# Patient Record
Sex: Female | Born: 1980 | Race: White | Hispanic: No | Smoking: Former smoker
Health system: Southern US, Community
[De-identification: ages and names within clinical notes are randomized; demographics above are authoritative.]

## PROBLEM LIST (undated history)

## (undated) DIAGNOSIS — N189 Chronic kidney disease, unspecified: Secondary | ICD-10-CM

## (undated) DIAGNOSIS — K8020 Calculus of gallbladder without cholecystitis without obstruction: Secondary | ICD-10-CM

## (undated) DIAGNOSIS — Z789 Other specified health status: Secondary | ICD-10-CM

## (undated) DIAGNOSIS — R87619 Unspecified abnormal cytological findings in specimens from cervix uteri: Secondary | ICD-10-CM

## (undated) HISTORY — PX: CHOLECYSTECTOMY: SHX55

## (undated) HISTORY — DX: Unspecified abnormal cytological findings in specimens from cervix uteri: R87.619

## (undated) HISTORY — PX: LEEP: SHX91

## (undated) HISTORY — PX: DILATION AND CURETTAGE OF UTERUS: SHX78

## (undated) HISTORY — PX: GALLBLADDER SURGERY: SHX652

## (undated) HISTORY — PX: OTHER SURGICAL HISTORY: SHX169

## (undated) HISTORY — DX: Calculus of gallbladder without cholecystitis without obstruction: K80.20

---

## 2003-02-07 ENCOUNTER — Encounter: Admission: RE | Admit: 2003-02-07 | Discharge: 2003-02-07 | Payer: Self-pay | Admitting: Sports Medicine

## 2003-02-14 ENCOUNTER — Encounter: Admission: RE | Admit: 2003-02-14 | Discharge: 2003-02-14 | Payer: Self-pay | Admitting: Sports Medicine

## 2003-02-14 ENCOUNTER — Other Ambulatory Visit: Admission: RE | Admit: 2003-02-14 | Discharge: 2003-02-14 | Payer: Self-pay | Admitting: Family Medicine

## 2003-03-04 ENCOUNTER — Inpatient Hospital Stay (HOSPITAL_COMMUNITY): Admission: AD | Admit: 2003-03-04 | Discharge: 2003-03-04 | Payer: Self-pay | Admitting: Obstetrics & Gynecology

## 2003-03-06 ENCOUNTER — Ambulatory Visit (HOSPITAL_COMMUNITY): Admission: AD | Admit: 2003-03-06 | Discharge: 2003-03-06 | Payer: Self-pay | Admitting: Obstetrics and Gynecology

## 2003-03-06 ENCOUNTER — Encounter (INDEPENDENT_AMBULATORY_CARE_PROVIDER_SITE_OTHER): Payer: Self-pay | Admitting: *Deleted

## 2003-03-22 ENCOUNTER — Encounter: Admission: RE | Admit: 2003-03-22 | Discharge: 2003-03-22 | Payer: Self-pay | Admitting: Family Medicine

## 2003-07-19 ENCOUNTER — Emergency Department (HOSPITAL_COMMUNITY): Admission: EM | Admit: 2003-07-19 | Discharge: 2003-07-19 | Payer: Self-pay | Admitting: Emergency Medicine

## 2004-01-23 ENCOUNTER — Ambulatory Visit: Payer: Self-pay | Admitting: Family Medicine

## 2004-01-30 ENCOUNTER — Ambulatory Visit: Payer: Self-pay | Admitting: Sports Medicine

## 2004-01-30 ENCOUNTER — Other Ambulatory Visit: Admission: RE | Admit: 2004-01-30 | Discharge: 2004-01-30 | Payer: Self-pay | Admitting: Family Medicine

## 2004-03-03 ENCOUNTER — Ambulatory Visit: Payer: Self-pay | Admitting: Family Medicine

## 2004-03-03 ENCOUNTER — Ambulatory Visit (HOSPITAL_COMMUNITY): Admission: RE | Admit: 2004-03-03 | Discharge: 2004-03-03 | Payer: Self-pay | Admitting: Family Medicine

## 2004-04-01 ENCOUNTER — Ambulatory Visit: Payer: Self-pay | Admitting: Family Medicine

## 2004-04-22 ENCOUNTER — Ambulatory Visit (HOSPITAL_COMMUNITY): Admission: RE | Admit: 2004-04-22 | Discharge: 2004-04-22 | Payer: Self-pay | Admitting: Family Medicine

## 2004-04-27 ENCOUNTER — Ambulatory Visit: Payer: Self-pay | Admitting: Family Medicine

## 2004-05-28 ENCOUNTER — Ambulatory Visit: Payer: Self-pay | Admitting: Family Medicine

## 2004-06-02 ENCOUNTER — Ambulatory Visit: Payer: Self-pay | Admitting: Family Medicine

## 2004-07-03 ENCOUNTER — Ambulatory Visit: Payer: Self-pay | Admitting: Sports Medicine

## 2004-07-20 ENCOUNTER — Ambulatory Visit: Payer: Self-pay | Admitting: Family Medicine

## 2004-08-03 ENCOUNTER — Ambulatory Visit: Payer: Self-pay | Admitting: Family Medicine

## 2004-08-17 ENCOUNTER — Ambulatory Visit: Payer: Self-pay | Admitting: Family Medicine

## 2004-08-23 ENCOUNTER — Ambulatory Visit: Payer: Self-pay | Admitting: Family Medicine

## 2004-08-23 ENCOUNTER — Inpatient Hospital Stay (HOSPITAL_COMMUNITY): Admission: AD | Admit: 2004-08-23 | Discharge: 2004-08-23 | Payer: Self-pay | Admitting: *Deleted

## 2004-08-31 ENCOUNTER — Ambulatory Visit: Payer: Self-pay | Admitting: Family Medicine

## 2004-09-07 ENCOUNTER — Inpatient Hospital Stay (HOSPITAL_COMMUNITY): Admission: AD | Admit: 2004-09-07 | Discharge: 2004-09-09 | Payer: Self-pay | Admitting: Obstetrics and Gynecology

## 2004-09-07 ENCOUNTER — Ambulatory Visit: Payer: Self-pay | Admitting: Family Medicine

## 2004-09-30 ENCOUNTER — Ambulatory Visit: Payer: Self-pay | Admitting: Family Medicine

## 2004-12-01 ENCOUNTER — Ambulatory Visit: Payer: Self-pay | Admitting: Sports Medicine

## 2005-02-04 ENCOUNTER — Encounter (INDEPENDENT_AMBULATORY_CARE_PROVIDER_SITE_OTHER): Payer: Self-pay | Admitting: *Deleted

## 2005-02-08 ENCOUNTER — Emergency Department (HOSPITAL_COMMUNITY): Admission: EM | Admit: 2005-02-08 | Discharge: 2005-02-08 | Payer: Self-pay | Admitting: Family Medicine

## 2005-02-18 ENCOUNTER — Ambulatory Visit: Payer: Self-pay | Admitting: Family Medicine

## 2005-03-01 ENCOUNTER — Ambulatory Visit: Payer: Self-pay | Admitting: Family Medicine

## 2005-03-17 ENCOUNTER — Emergency Department (HOSPITAL_COMMUNITY): Admission: EM | Admit: 2005-03-17 | Discharge: 2005-03-17 | Payer: Self-pay | Admitting: Emergency Medicine

## 2005-03-23 ENCOUNTER — Emergency Department (HOSPITAL_COMMUNITY): Admission: EM | Admit: 2005-03-23 | Discharge: 2005-03-23 | Payer: Self-pay | Admitting: Emergency Medicine

## 2005-03-23 ENCOUNTER — Ambulatory Visit: Payer: Self-pay | Admitting: Sports Medicine

## 2005-03-30 ENCOUNTER — Ambulatory Visit: Payer: Self-pay | Admitting: Sports Medicine

## 2005-04-29 ENCOUNTER — Emergency Department (HOSPITAL_COMMUNITY): Admission: EM | Admit: 2005-04-29 | Discharge: 2005-04-29 | Payer: Self-pay | Admitting: Family Medicine

## 2005-05-07 ENCOUNTER — Ambulatory Visit: Payer: Self-pay | Admitting: Family Medicine

## 2005-05-10 ENCOUNTER — Emergency Department (HOSPITAL_COMMUNITY): Admission: EM | Admit: 2005-05-10 | Discharge: 2005-05-10 | Payer: Self-pay | Admitting: Family Medicine

## 2005-05-17 ENCOUNTER — Ambulatory Visit: Payer: Self-pay | Admitting: Family Medicine

## 2005-06-04 ENCOUNTER — Emergency Department (HOSPITAL_COMMUNITY): Admission: EM | Admit: 2005-06-04 | Discharge: 2005-06-04 | Payer: Self-pay | Admitting: Family Medicine

## 2005-07-29 ENCOUNTER — Emergency Department (HOSPITAL_COMMUNITY): Admission: EM | Admit: 2005-07-29 | Discharge: 2005-07-29 | Payer: Self-pay | Admitting: Emergency Medicine

## 2005-08-02 ENCOUNTER — Ambulatory Visit: Payer: Self-pay | Admitting: Family Medicine

## 2005-10-18 ENCOUNTER — Ambulatory Visit: Payer: Self-pay | Admitting: Family Medicine

## 2005-11-12 ENCOUNTER — Ambulatory Visit: Payer: Self-pay | Admitting: Family Medicine

## 2005-11-13 ENCOUNTER — Emergency Department (HOSPITAL_COMMUNITY): Admission: EM | Admit: 2005-11-13 | Discharge: 2005-11-13 | Payer: Self-pay | Admitting: Emergency Medicine

## 2006-01-07 ENCOUNTER — Ambulatory Visit: Payer: Self-pay | Admitting: Family Medicine

## 2006-03-04 ENCOUNTER — Encounter (INDEPENDENT_AMBULATORY_CARE_PROVIDER_SITE_OTHER): Payer: Self-pay | Admitting: *Deleted

## 2006-03-28 ENCOUNTER — Ambulatory Visit: Payer: Self-pay | Admitting: Family Medicine

## 2006-06-30 ENCOUNTER — Other Ambulatory Visit: Admission: RE | Admit: 2006-06-30 | Discharge: 2006-06-30 | Payer: Self-pay | Admitting: Sports Medicine

## 2006-06-30 ENCOUNTER — Encounter (INDEPENDENT_AMBULATORY_CARE_PROVIDER_SITE_OTHER): Payer: Self-pay | Admitting: Family Medicine

## 2006-06-30 ENCOUNTER — Ambulatory Visit: Payer: Self-pay | Admitting: Sports Medicine

## 2006-06-30 DIAGNOSIS — F17200 Nicotine dependence, unspecified, uncomplicated: Secondary | ICD-10-CM

## 2006-06-30 LAB — CONVERTED CEMR LAB
Beta hcg, urine, semiquantitative: NEGATIVE
CO2: 22 meq/L (ref 19–32)
Chlamydia, DNA Probe: NEGATIVE
Chloride: 108 meq/L (ref 96–112)
GC Probe Amp, Genital: NEGATIVE
Glucose, Bld: 94 mg/dL (ref 70–99)
Platelets: 277 10*3/uL (ref 150–400)
Potassium: 3.7 meq/L (ref 3.5–5.3)
RBC: 5.01 M/uL (ref 3.87–5.11)
Sodium: 144 meq/L (ref 135–145)
WBC: 10.1 10*3/uL (ref 4.0–10.5)

## 2006-07-18 ENCOUNTER — Encounter (INDEPENDENT_AMBULATORY_CARE_PROVIDER_SITE_OTHER): Payer: Self-pay | Admitting: Family Medicine

## 2006-07-21 ENCOUNTER — Telehealth: Payer: Self-pay | Admitting: *Deleted

## 2006-08-16 ENCOUNTER — Ambulatory Visit: Payer: Self-pay | Admitting: Family Medicine

## 2007-04-28 ENCOUNTER — Emergency Department (HOSPITAL_COMMUNITY): Admission: EM | Admit: 2007-04-28 | Discharge: 2007-04-28 | Payer: Self-pay | Admitting: Emergency Medicine

## 2007-05-21 ENCOUNTER — Emergency Department (HOSPITAL_COMMUNITY): Admission: EM | Admit: 2007-05-21 | Discharge: 2007-05-21 | Payer: Self-pay | Admitting: Family Medicine

## 2007-06-13 ENCOUNTER — Ambulatory Visit: Payer: Self-pay | Admitting: Family Medicine

## 2007-06-13 LAB — CONVERTED CEMR LAB
Ketones, urine, test strip: NEGATIVE
Nitrite: NEGATIVE
Urobilinogen, UA: 0.2

## 2007-10-10 ENCOUNTER — Other Ambulatory Visit: Admission: RE | Admit: 2007-10-10 | Discharge: 2007-10-10 | Payer: Self-pay | Admitting: Family Medicine

## 2007-10-10 ENCOUNTER — Ambulatory Visit: Payer: Self-pay | Admitting: Family Medicine

## 2007-10-10 ENCOUNTER — Encounter: Payer: Self-pay | Admitting: Family Medicine

## 2007-10-10 DIAGNOSIS — E663 Overweight: Secondary | ICD-10-CM | POA: Insufficient documentation

## 2008-01-22 ENCOUNTER — Telehealth: Payer: Self-pay | Admitting: *Deleted

## 2008-01-23 ENCOUNTER — Ambulatory Visit: Payer: Self-pay | Admitting: Family Medicine

## 2008-06-08 ENCOUNTER — Emergency Department (HOSPITAL_COMMUNITY): Admission: EM | Admit: 2008-06-08 | Discharge: 2008-06-08 | Payer: Self-pay | Admitting: Emergency Medicine

## 2008-06-10 ENCOUNTER — Telehealth: Payer: Self-pay | Admitting: Family Medicine

## 2008-06-11 ENCOUNTER — Ambulatory Visit: Payer: Self-pay | Admitting: Family Medicine

## 2008-06-17 ENCOUNTER — Telehealth: Payer: Self-pay | Admitting: Family Medicine

## 2008-06-17 ENCOUNTER — Ambulatory Visit: Payer: Self-pay | Admitting: Family Medicine

## 2008-07-21 IMAGING — CT CT ABDOMEN W/O CM
2 of 4 series · 17 of 46 positions shown, 19 images · non-contrast
Comparison: None

CT ABDOMEN

CLINICAL DATA: Abdominal and left flank pain

CT ABDOMEN AND PELVIS WITHOUT CONTRAST
TECHNIQUE: Multidetector CT imaging of the abdomen and pelvis was
performed following the standard
protocol without intravenous contrast.

[Series 2: renal stone 5.0 b31f st · axial · 0.81mm/px · z∈[-493,-53]mm · 14 of 96 slices shown, 16 images]
[im 4/96  soft-tissue]
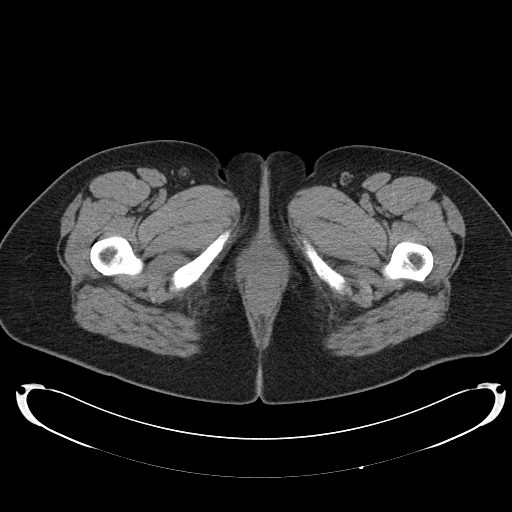
[im 4/96  bone]
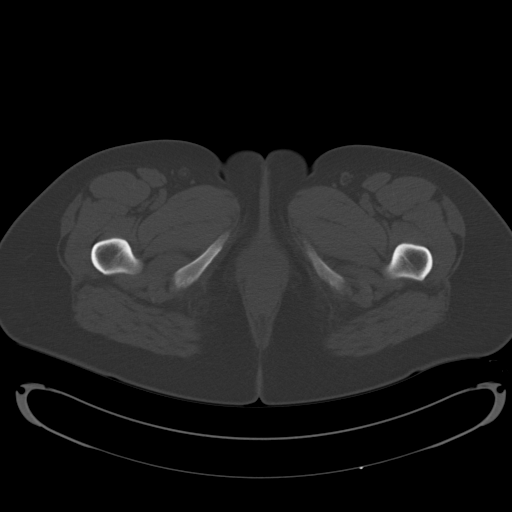
[im 12/96  soft-tissue]
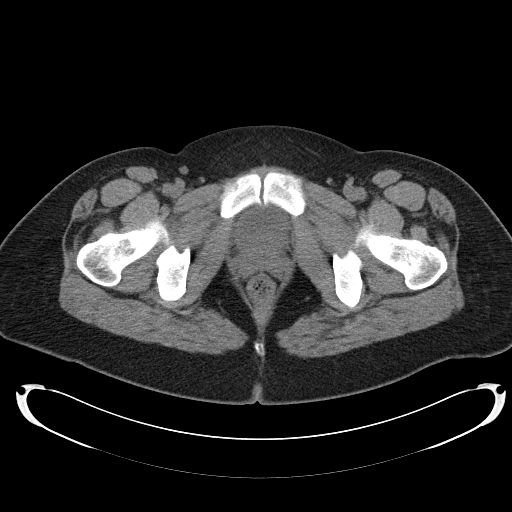
[im 20/96  soft-tissue]
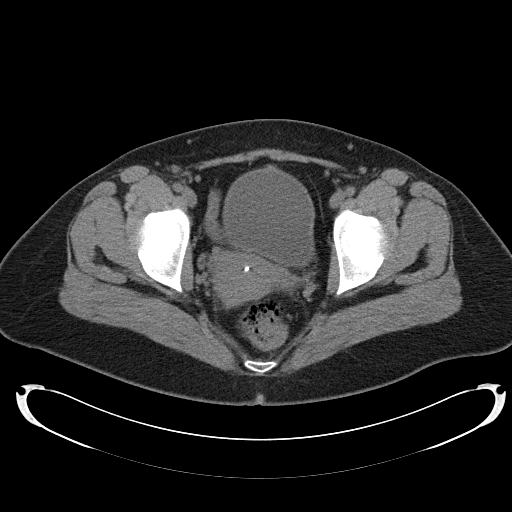
[im 24/96  soft-tissue]
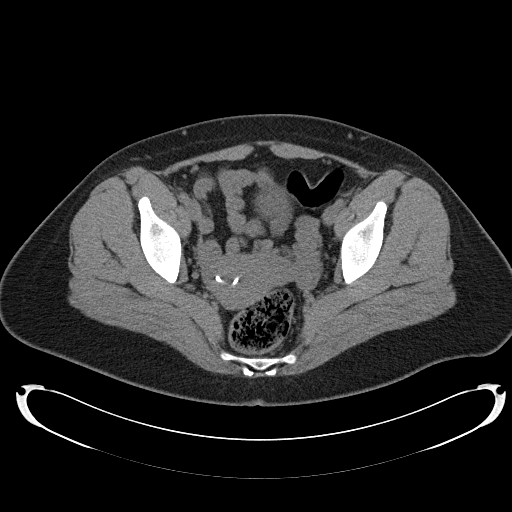
[im 32/96  soft-tissue]
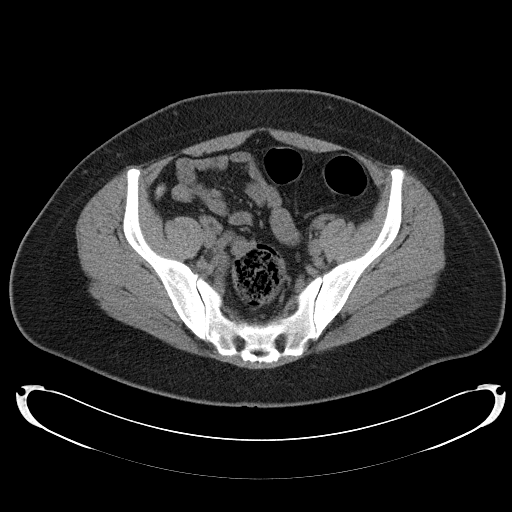
[im 40/96  soft-tissue]
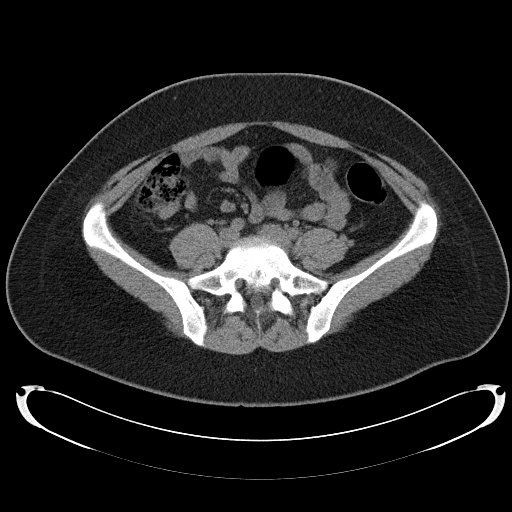
[im 44/96  soft-tissue]
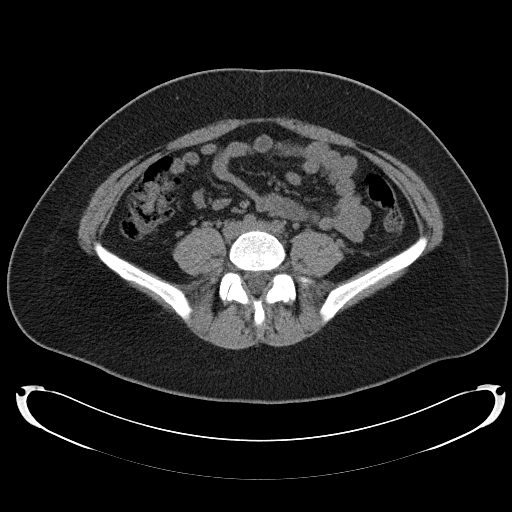
[im 52/96  soft-tissue]
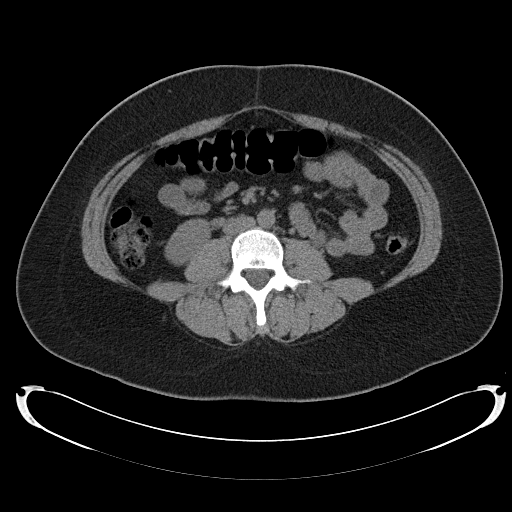
[im 56/96  soft-tissue]
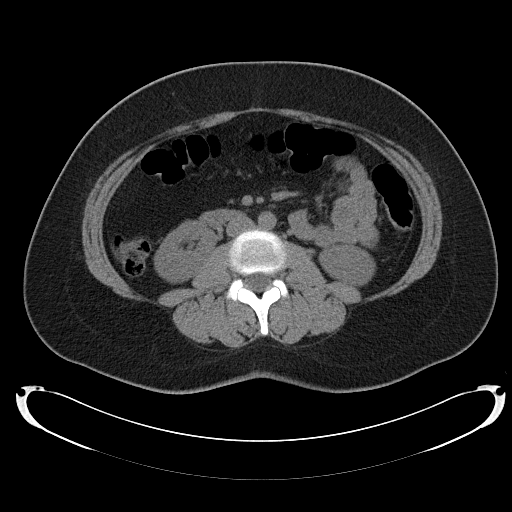
[im 56/96  bone]
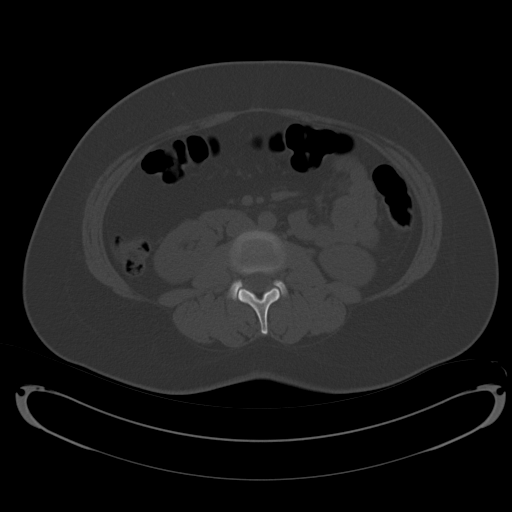
[im 64/96  soft-tissue]
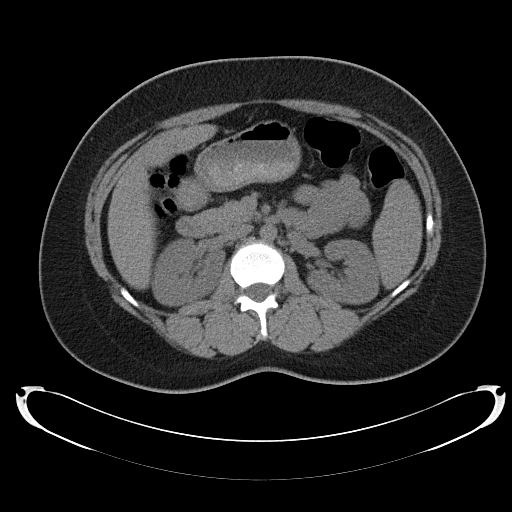
[im 72/96  soft-tissue]
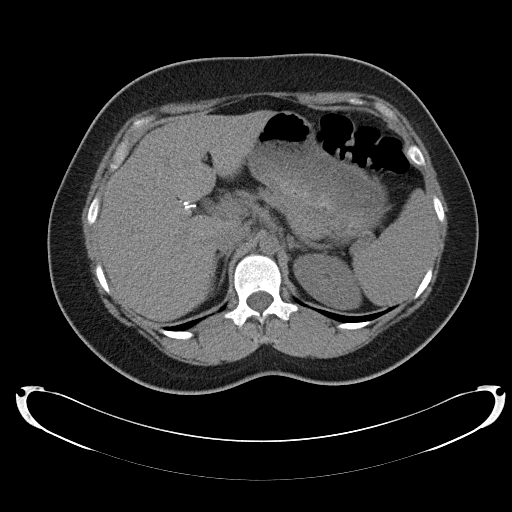
[im 76/96  soft-tissue]
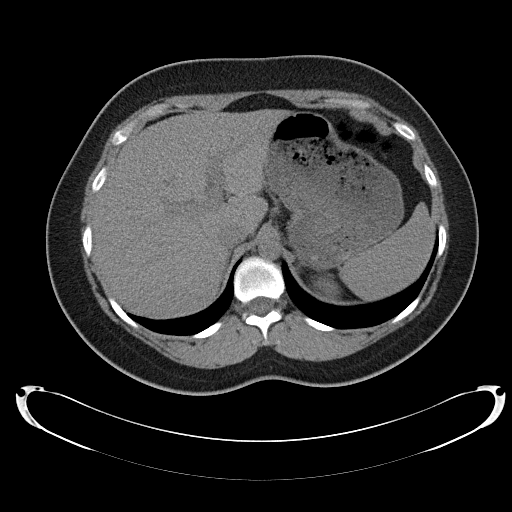
[im 84/96  soft-tissue]
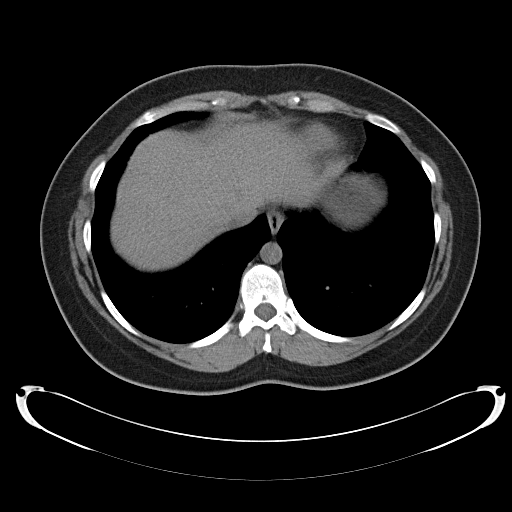
[im 92/96  soft-tissue]
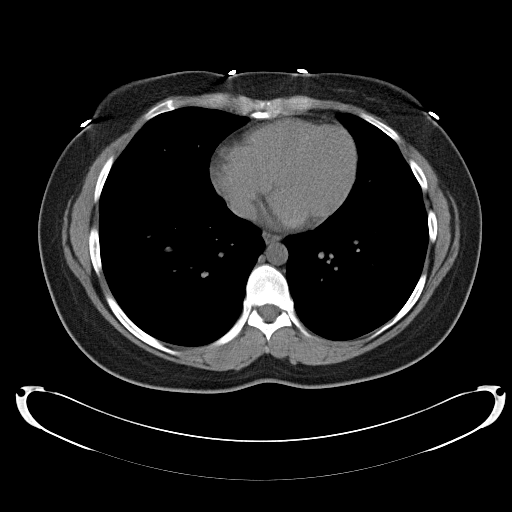

[Series 5: renal stone 2.0 spo cor st · coronal · 0.94mm/px · 3 of 107 slices shown]
[im 36/107  soft-tissue]
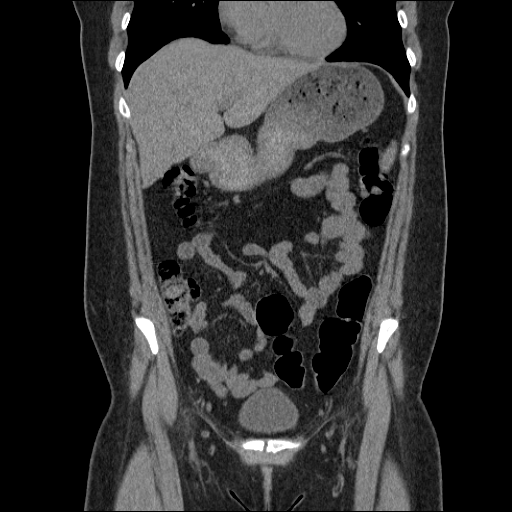
[im 48/107  soft-tissue]
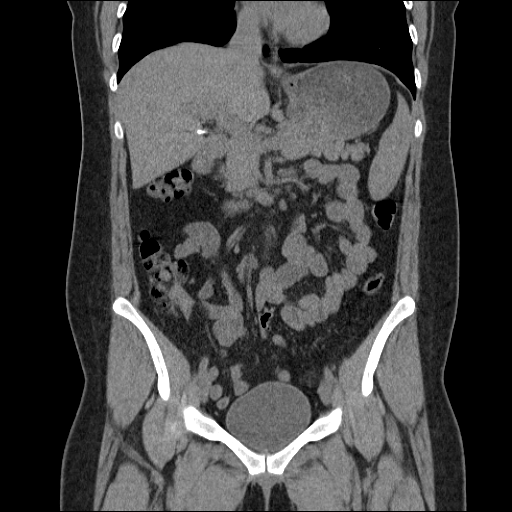
[im 59/107  soft-tissue]
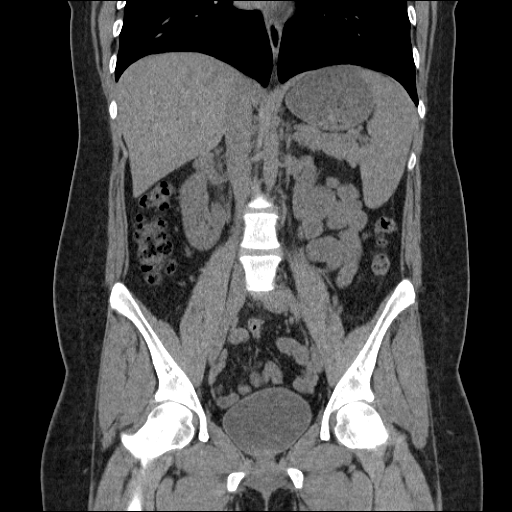

[17 of 46 positions shown; findings below may reference images not displayed]

FINDINGS: The lung bases are clear.  Abdominal viscera are
unremarkable.  Specifically, no radiopaque renal or ureteral
calculus is identified.  No hydronephrosis.  Cholecystectomy clips
are identified.  No ascites or lymphadenopathy.
IMPRESSION: No acute intra-abdominal finding.  No renal or ureteral radiopaque
calculus identified.

CT PELVIS
FINDINGS: An IUD is in place within the uterus.  Pelvic viscera
are otherwise unremarkable.  No lymphadenopathy or free fluid.
Osseous structures are intact. Incidental note is made of partial
fusion of the right sacroiliac joint.
IMPRESSION: No acute intrapelvic finding.

## 2008-12-05 ENCOUNTER — Other Ambulatory Visit: Admission: RE | Admit: 2008-12-05 | Discharge: 2008-12-05 | Payer: Self-pay | Admitting: Family Medicine

## 2008-12-05 ENCOUNTER — Encounter: Payer: Self-pay | Admitting: Family Medicine

## 2008-12-05 ENCOUNTER — Ambulatory Visit: Payer: Self-pay | Admitting: Family Medicine

## 2008-12-05 LAB — CONVERTED CEMR LAB
ALT: 10 units/L (ref 0–35)
AST: 13 units/L (ref 0–37)
CO2: 22 meq/L (ref 19–32)
Chlamydia, DNA Probe: NEGATIVE
Chloride: 104 meq/L (ref 96–112)
Creatinine, Ser: 0.76 mg/dL (ref 0.40–1.20)
HCT: 39.7 % (ref 36.0–46.0)
MCV: 84.3 fL (ref 78.0–100.0)
Platelets: 300 10*3/uL (ref 150–400)
RDW: 12.8 % (ref 11.5–15.5)
Sodium: 138 meq/L (ref 135–145)
Total Bilirubin: 0.6 mg/dL (ref 0.3–1.2)
Total Protein: 6.7 g/dL (ref 6.0–8.3)
WBC: 10.6 10*3/uL — ABNORMAL HIGH (ref 4.0–10.5)

## 2008-12-26 ENCOUNTER — Encounter: Payer: Self-pay | Admitting: Family Medicine

## 2009-05-16 ENCOUNTER — Ambulatory Visit: Payer: Self-pay | Admitting: Family Medicine

## 2009-11-12 ENCOUNTER — Encounter: Payer: Self-pay | Admitting: Family Medicine

## 2009-12-16 ENCOUNTER — Encounter: Payer: Self-pay | Admitting: Family Medicine

## 2010-02-03 NOTE — Miscellaneous (Signed)
  Clinical Lists Changes  Problems: Changed problem from INSERTION, IUD 08/2006 MIRENA (ICD-V25.1) to IUD SURVEILLANCE (ICD-V25.42)

## 2010-02-03 NOTE — Assessment & Plan Note (Signed)
Summary: contraceptive managment   Vital Signs:  Patient profile:   30 year old female Height:      68 inches Weight:      196.1 pounds BMI:     29.92 Temp:     99.5 degrees F oral Pulse rate:   95 / minute BP sitting:   125 / 79  (right arm) Cuff size:   regular  Vitals Entered By: Garen Grams LPN (May 16, 2009 3:54 PM) CC: IUD removal and replacemant Is Patient Diabetic? No Pain Assessment Patient in pain? no        Primary Care Provider:  Cat Ta MD  CC:  IUD removal and replacemant.  History of Present Illness: requests mirena IUD removal and replacement: Pt states that she feels she recieved her IUD 5 years ago after the birth of her son.  On review of the chart I found that she had it placed 08/2006.  Therefore the IUD is good for another 1.5 years.  Pt is uptodate on pap smear recieved on 12/2009.    Habits & Providers  Alcohol-Tobacco-Diet     Tobacco Status: never  Current Medications (verified): 1)  Flonase 50 Mcg/act Susp (Fluticasone Propionate) .... Two Squirts in Each Nostril Daily. 2)  Triamcinolone Acetonide 0.5 % Crea (Triamcinolone Acetonide) .... Apply To Affected Areas Two Times A Day X 1 Week Then Once A Day X 1 Week (Avoid The Use On The Face) Disp: Large  Allergies (verified): No Known Drug Allergies  Social History: Smoking Status:  never  Review of Systems  The patient denies anorexia, fever, weight loss, chest pain, and syncope.    Physical Exam  General:  Well-developed,well-nourished,in no acute distress; alert,appropriate and cooperative throughout examination Lungs:  Normal respiratory effort, chest expands symmetrically. Msk:  normal gait Neurologic:  alert & oriented X3.   Skin:  Intact without suspicious lesions or rashes Psych:  Cognition and judgment appear intact. Alert and cooperative with normal attention span and concentration. No apparent delusions, illusions, hallucinations   Impression & Recommendations:  Problem  # 1:  Preventive Health Care (ICD-V70.0) discussed with pt how medicaid will not pay for removal and reinsertion of IUD prior to the 5 year time span.  I told pt of my concern of her having to pay out of pocket for this service.  Pt states that she will wait until the 5 year mark and return at that time.   Did take the opportunity at this visit to encouage her to stop smoking as well as to encourage weight loss.    Problem # 2:  SMOKER (ICD-305.1) discussed importance of smoking cessation.  Complete Medication List: 1)  Flonase 50 Mcg/act Susp (Fluticasone propionate) .... Two squirts in each nostril daily. 2)  Triamcinolone Acetonide 0.5 % Crea (Triamcinolone acetonide) .... Apply to affected areas two times a day x 1 week then once a day x 1 week (avoid the use on the face) disp: large  Other Orders: [facility]- Est Level  3 ([id number])  Patient Instructions: 1)  You need to return for replacement of mirena in 08/2011.   Was placed 08/2006.    2)  Return as needed. 3)  Talk with Rudell Cobb about reduced fees for medical care since medicaid may be running out.

## 2010-02-05 NOTE — Miscellaneous (Signed)
Summary: Changing prob list   Clinical Lists Changes  Problems: Removed problem of SMOKER (ICD-305.1) Removed problem of CONTRACEPTIVE MANAGEMENT (ICD-V25.09) Removed problem of PHYSICAL EXAMINATION (ICD-V70.0) Removed problem of CONTACT OR EXPOSURE TO OTHER VIRAL DISEASES (ICD-V01.79) Removed problem of RASH AND OTHER NONSPECIFIC SKIN ERUPTION (ICD-782.1) Removed problem of VAGINAL PRURITUS (ICD-698.1) Removed problem of UPPER RESPIRATORY INFECTION (ICD-465.9) Removed problem of IUD SURVEILLANCE (ICD-V25.42) Removed problem of SCREENING FOR MALIGNANT NEOPLASM, CERVIX (ICD-V76.2) Removed problem of Status post  COLPOSCOPY CERVIX VAG LOOP ELTRD BX CERVIX ([id number]) - LEEP procedure 2005 Removed problem of PAP SMEAR, ABNORMAL, ASCUS (ICD-795.01)

## 2010-05-22 NOTE — Op Note (Signed)
NAMEAUBRYANA, Debra Dominguez                       ACCOUNT NO.:  [id number]   MEDICAL RECORD NO.:  [id number]                   PATIENT TYPE:  [facility]   LOCATION:  [facility]                                 FACILITY:  [facility]   PHYSICIAN:  Tanya S. Shawnie Pons, M.D.                DATE OF BIRTH:  Feb 25, 1980   DATE OF PROCEDURE:  03/06/2003  DATE OF DISCHARGE:                                 OPERATIVE REPORT   PREOPERATIVE DIAGNOSIS:  Missed abortion.   POSTOPERATIVE DIAGNOSIS:  Missed abortion.   PROCEDURE:  Suction dilation and curettage.   SURGEON:  Shelbie Proctor. Shawnie Pons, M.D.   ANESTHESIA:  MAC.   ANESTHESIOLOGIST:  Burnett Corrente, M.D.   ESTIMATED BLOOD LOSS:  Less than 25 mL.   COMPLICATIONS:  None.   SPECIMENS:  Uterine contents.   REASON FOR PROCEDURE:  The patient is a 30 year old gravida 2, para 1, who  had a missed AB at approximately seven weeks.  She asked for a D&C.  On the  day of the surgery, she actually came in with heavy vaginal bleeding and the  gestational sac was found to be in the lower uterine segment.   PROCEDURE:  The patient was taken to the OR.  She was placed in the dorsal  lithotomy position  in Dupo stirrups.  Anesthesia was administered.  She  was prepped and draped in the usual sterile fashion.  A red rubber catheter  was used to drain her bladder.  The speculum was placed in the vagina.  The  cervix was visualized and grasped with a single tooth tenaculum.  The cervix  was injected with 1% Marcaine with epinephrine paracervical block.  Dilation  did not have to be performed as the patient was already dilated.  The uterus  was sounded to 9 cm.  Suction was used to empty the uterus x 2 and then  curettage was used to remove the rest of the uterine contents.  The suction  was passed, again, x 2 with very little tissue or blood found.  The curet  was passed again and there was a gritty texture in all four quadrants.  At  this time, all instrument, needle, and lap counts  were correct x 2.  The  patient was awakened and taken to recovery room in stable condition.                                               Shelbie Proctor. Shawnie Pons, M.D.    TSP/MEDQ  D:  03/06/2003  T:  03/06/2003  Job:  [id number]

## 2010-07-16 ENCOUNTER — Other Ambulatory Visit (HOSPITAL_COMMUNITY)
Admission: RE | Admit: 2010-07-16 | Discharge: 2010-07-16 | Disposition: A | Discharge status: Home / Self Care | Payer: Medicaid Other | Source: Ambulatory Visit | Attending: Family Medicine | Admitting: Family Medicine

## 2010-07-16 ENCOUNTER — Encounter: Payer: Self-pay | Admitting: Family Medicine

## 2010-07-16 ENCOUNTER — Ambulatory Visit (INDEPENDENT_AMBULATORY_CARE_PROVIDER_SITE_OTHER): Payer: Medicaid Other | Admitting: Family Medicine

## 2010-07-16 VITALS — BP 128/85 | HR 76 | Ht 67.0 in | Wt 188.0 lb

## 2010-07-16 DIAGNOSIS — Z01419 Encounter for gynecological examination (general) (routine) without abnormal findings: Secondary | ICD-10-CM | POA: Insufficient documentation

## 2010-07-16 DIAGNOSIS — Z01818 Encounter for other preprocedural examination: Secondary | ICD-10-CM

## 2010-07-16 DIAGNOSIS — E663 Overweight: Secondary | ICD-10-CM

## 2010-07-16 DIAGNOSIS — F17200 Nicotine dependence, unspecified, uncomplicated: Secondary | ICD-10-CM

## 2010-07-16 DIAGNOSIS — Z124 Encounter for screening for malignant neoplasm of cervix: Secondary | ICD-10-CM

## 2010-07-16 DIAGNOSIS — Z1283 Encounter for screening for malignant neoplasm of skin: Secondary | ICD-10-CM

## 2010-07-16 NOTE — Progress Notes (Signed)
  Subjective:    Patient ID: Debra Dominguez, female    DOB: 1980-03-23, 30 y.o.   MRN: [id number]  HPI Pap-  Pt told that she will need yearly pap smears.  Leep in 1998- had a couple of abnormal paps since that time but the biopsies have come back negative.  Refuses GC/Chlam/RPR/HIV testing.  States she is not at risk for STD's.  No vaginal discharge. No foul odorl  Tubal- Pt requesting tubal ligation for birth control.  IUD is good for 1 more year.  States she has no desire for further children.  Had some general questions about the surgery and how it is done, etc. Would like referral to ob/gyn for this procedure.  Mole- Pt states she has lots of moles and freckles on body.  Has one small red spot on left lower let that she hits with razor a lot.  Sometimes bleeds.  Non tender.  States that she has noticed any changes in other moles or areas on skin.  Weight loss- Pt states she is not really exercising or changing her diet at this time.  Would like to know what her ideal weight would be.  Asks if weight loss is "really that important"  Pt denies any breast nodules, lumps or bumps. No nipple discharge.  PMH, Surgical hx, family hx, and social history update in chart in appropriate tabs    Review of Systems   as per above Objective:   Physical Exam  Constitutional: She is oriented to person, place, and time. She appears well-developed and well-nourished.  HENT:  Head: Normocephalic and atraumatic.  Eyes: Pupils are equal, round, and reactive to light.  Neck: Normal range of motion. No thyromegaly present.  Cardiovascular: Normal rate and regular rhythm.  Exam reveals friction rub. Exam reveals no gallop.   No murmur heard. Pulmonary/Chest: Effort normal. No respiratory distress. She has no wheezes.  Abdominal: Soft. She exhibits no distension and no mass. There is no tenderness. There is no rebound and no guarding.  Genitourinary: Uterus normal.    There is no rash or lesion on  the right labia. There is no rash on the left labia. Cervix exhibits friability. Cervix exhibits no motion tenderness and no discharge. Right adnexum displays no mass, no tenderness and no fullness. Left adnexum displays no mass, no tenderness and no fullness. No erythema or tenderness around the vagina. No signs of injury around the vagina.  Musculoskeletal: Normal range of motion. She exhibits no edema.  Neurological: She is alert and oriented to person, place, and time.  Skin: No rash noted.       Small erythematous papule approx 4mm in diameter on left lateral mid shin area.   Psychiatric: She has a normal mood and affect. Her behavior is normal. Judgment and thought content normal.          Assessment & Plan:

## 2010-07-16 NOTE — Assessment & Plan Note (Signed)
Pt requesting tubal ligation.  Risk and benefits discussed.  Handout given.  [facility]

## 2010-07-16 NOTE — Assessment & Plan Note (Signed)
Encouraged smoking cessation 

## 2010-07-16 NOTE — Patient Instructions (Signed)
I will mail you your results.  We will complete the form for your tubal. Return for removal of area on left leg. Keep eye on moles for any changing.  Return in 1 year for yearly physical Let me know if you would like to return for a follow up visit just to focus on making a plan for your weight loss.

## 2010-07-16 NOTE — Assessment & Plan Note (Signed)
Obtained today 07/16/10- will mail results to pt.  Pt to f/up in 1 year.  Sooner if any abnormality on papsmear.

## 2010-07-16 NOTE — Assessment & Plan Note (Signed)
Small red erythematous papule on left lower leg suspicious for basal cell carcinoma.  Pt to return for removal of this lesion.  Macule on left labia - Pt unaware of how long it has been present, pt to monitor, pt is to let me know if any changes.

## 2010-07-16 NOTE — Assessment & Plan Note (Signed)
Pt states her goal weight is 175.  Pt states she is not currently on a diet or using an exercise plan.  Instructed pt to make a follow up appointment if she would like me to help her to make a weight loss plan.

## 2010-09-14 ENCOUNTER — Telehealth: Payer: Self-pay | Admitting: Family Medicine

## 2010-09-14 NOTE — Telephone Encounter (Signed)
Called and left message to call back. Please tell pt: Papers for BTL were sent to [facility] 07-16-10. Pt can call [phone] and check about appt. Lorenda Hatchet, Renato Battles

## 2010-09-14 NOTE — Telephone Encounter (Signed)
Is calling about the referral to get her Tubes tide.  She was in on 7/12 but she hasn't heard anything back.

## 2010-09-15 ENCOUNTER — Telehealth: Payer: Self-pay | Admitting: *Deleted

## 2010-09-15 NOTE — Telephone Encounter (Signed)
Called pt and informed that she has appt at [facility] for BTL 09-17-10 at 3:30 pm. .Debra Dominguez

## 2010-09-17 ENCOUNTER — Ambulatory Visit (INDEPENDENT_AMBULATORY_CARE_PROVIDER_SITE_OTHER): Payer: Medicaid Other | Admitting: Obstetrics & Gynecology

## 2010-09-17 ENCOUNTER — Encounter: Payer: Self-pay | Admitting: Obstetrics & Gynecology

## 2010-09-17 VITALS — BP 128/77 | HR 96 | Temp 98.3°F | Ht 67.0 in | Wt 187.0 lb

## 2010-09-17 DIAGNOSIS — Z3009 Encounter for other general counseling and advice on contraception: Secondary | ICD-10-CM

## 2010-09-17 NOTE — Progress Notes (Addendum)
Subjective:    Patient ID: Debra Dominguez, female    DOB: 03-Dec-1980, 30 y.o.   MRN: [id number]  [id number] No LMP recorded. Patient is not currently having periods (Reason: IUD). Requesting to schedule BTL. The procedure for laparoscopic BTL was explained and the risks. Questions were answered. She signed Medicaid papers 7/12 at [facility], Dr. Edmonia Saphira Lahmann   Past Medical History  Diagnosis Date  . Cervical cancer 1998    had leep procedure- yearly pap smears now  . Gallstones    Past Surgical History  Procedure Date  . Gallbladder surgery   . Cholecystectomy    No Known Allergies. No current outpatient prescriptions on file prior to visit.   History   Social History  . Marital Status: Married    Spouse Name: N/A    Number of Children: N/A  . Years of Education: N/A   Occupational History  . Not on file.   Social History Main Topics  . Smoking status: Current Some Day Smoker -- 0.3 packs/day    Types: Cigarettes  . Smokeless tobacco: Never Used  . Alcohol Use: Yes     1 drink per month  . Drug Use: No  . Sexually Active: Yes    Birth Control/ Protection: IUD     1 partner- IUD needs to be replaced in 2013   Other Topics Concern  . 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       Review of Systemsamenorrhea, no vaginal or bladder sx     Filed Vitals:   09/17/10 1517  BP: 128/77  Pulse: 96  Temp: 98.3 F (36.8 C)  TempSrc: Oral  Height: 5\' 7"  (1.702 m)  Weight: 187 lb (84.823 kg)    Objective:   Physical Exam  Constitutional: She appears well-developed and well-nourished.  Cardiovascular: Normal rate and normal heart sounds.   Pulmonary/Chest: Breath sounds normal.  Abdominal: Soft. She exhibits no mass. There is no tenderness.  Genitourinary:       Deferred  Skin: Skin is warm and dry.  Psychiatric: She has a normal mood and affect.          Assessment & Plan:  Requests  sterilization. Schedule BTL and IUD removal.

## 2010-09-29 LAB — POCT URINALYSIS DIP (DEVICE)
Bilirubin Urine: NEGATIVE
Ketones, ur: NEGATIVE
Operator id: 247071
Specific Gravity, Urine: 1.01

## 2010-09-29 LAB — POCT PREGNANCY, URINE: Operator id: 247071

## 2010-10-01 ENCOUNTER — Encounter (HOSPITAL_COMMUNITY)
Admission: RE | Admit: 2010-10-01 | Discharge: 2010-10-01 | Disposition: A | Discharge status: Home / Self Care | Payer: Medicaid Other | Source: Ambulatory Visit | Attending: Obstetrics & Gynecology | Admitting: Obstetrics & Gynecology

## 2010-10-01 ENCOUNTER — Encounter (HOSPITAL_COMMUNITY): Payer: Self-pay

## 2010-10-01 HISTORY — DX: Other specified health status: Z78.9

## 2010-10-01 LAB — CBC
HCT: 37.4 % (ref 36.0–46.0)
Hemoglobin: 12.8 g/dL (ref 12.0–15.0)
MCV: 86.2 fL (ref 78.0–100.0)
RBC: 4.34 MIL/uL (ref 3.87–5.11)
RDW: 12.7 % (ref 11.5–15.5)
WBC: 9 10*3/uL (ref 4.0–10.5)

## 2010-10-01 LAB — SURGICAL PCR SCREEN: Staphylococcus aureus: POSITIVE — AB

## 2010-10-01 NOTE — Patient Instructions (Signed)
   Your procedure is scheduled on:10/08/10  Enter through the Main Entrance of [facility] at:1:00pm Pick up the phone at the desk and dial [phone] and inform us of your arrival  Please call this number if you have any problems the morning of surgery: [phone]  Remember: Do not eat food after midnight  Do not drink clear liquids after:1030 am Thursday Take these medicines the morning of surgery with a SIP OF WATER:none  Do not wear jewelry, make-up, or FINGER nail polish Do not wear lotions, powders, or perfumes.  You may wear deodorant. Do not shave 48 hours prior to surgery. Do not bring valuables to the hospital. Leave suitcase in the car. After Surgery it may be brought to your room. For patients being admitted to the hospital, checkout time is 11:00am the day of discharge.  Patients discharged on the day of surgery will not be allowed to drive home.   Name and phone number of your driver: Calla Kicks- [phone]   Remember to use your hibiclens as instructed.Please shower with 1/2 bottle the evening before your surgery and the other 1/2 bottle the morning of surgery.

## 2010-10-08 ENCOUNTER — Encounter (HOSPITAL_COMMUNITY)
Admission: RE | Disposition: A | Discharge status: Home / Self Care | Payer: Self-pay | Source: Ambulatory Visit | Attending: Obstetrics & Gynecology

## 2010-10-08 ENCOUNTER — Encounter (HOSPITAL_COMMUNITY): Payer: Self-pay | Admitting: Anesthesiology

## 2010-10-08 ENCOUNTER — Encounter (HOSPITAL_COMMUNITY): Payer: Self-pay | Admitting: *Deleted

## 2010-10-08 ENCOUNTER — Encounter: Payer: Self-pay | Admitting: Obstetrics & Gynecology

## 2010-10-08 ENCOUNTER — Ambulatory Visit (HOSPITAL_COMMUNITY)
Admission: RE | Admit: 2010-10-08 | Discharge: 2010-10-08 | Disposition: A | Discharge status: Home / Self Care | Payer: Medicaid Other | Source: Ambulatory Visit | Attending: Obstetrics & Gynecology | Admitting: Obstetrics & Gynecology

## 2010-10-08 ENCOUNTER — Ambulatory Visit (HOSPITAL_COMMUNITY): Payer: Medicaid Other | Admitting: Anesthesiology

## 2010-10-08 DIAGNOSIS — Z01812 Encounter for preprocedural laboratory examination: Secondary | ICD-10-CM | POA: Insufficient documentation

## 2010-10-08 DIAGNOSIS — Z302 Encounter for sterilization: Secondary | ICD-10-CM | POA: Insufficient documentation

## 2010-10-08 DIAGNOSIS — Z01818 Encounter for other preprocedural examination: Secondary | ICD-10-CM | POA: Insufficient documentation

## 2010-10-08 HISTORY — PX: LAPAROSCOPIC TUBAL LIGATION: SHX1937

## 2010-10-08 LAB — PREGNANCY, URINE: Preg Test, Ur: NEGATIVE

## 2010-10-08 SURGERY — LIGATION, FALLOPIAN TUBE, LAPAROSCOPIC
Anesthesia: General | Site: Abdomen | Laterality: Bilateral | Wound class: Clean Contaminated

## 2010-10-08 MED ORDER — FENTANYL CITRATE 0.05 MG/ML IJ SOLN
25.0000 ug | INTRAMUSCULAR | Status: DC | PRN
  Administered 2010-10-08: 25 ug via INTRAVENOUS

## 2010-10-08 MED ORDER — ONDANSETRON HCL 4 MG/2ML IJ SOLN
INTRAMUSCULAR | Status: DC | PRN
  Administered 2010-10-08: 4 mg via INTRAVENOUS

## 2010-10-08 MED ORDER — LIDOCAINE HCL (CARDIAC) 20 MG/ML IV SOLN
INTRAVENOUS | Status: DC | PRN
  Administered 2010-10-08: 80 mg via INTRAVENOUS

## 2010-10-08 MED ORDER — LACTATED RINGERS IV SOLN
INTRAVENOUS | Status: DC
  Administered 2010-10-08 (×2): via INTRAVENOUS

## 2010-10-08 MED ORDER — ROCURONIUM BROMIDE 100 MG/10ML IV SOLN
INTRAVENOUS | Status: DC | PRN
  Administered 2010-10-08: 35 mg via INTRAVENOUS

## 2010-10-08 MED ORDER — OXYCODONE-ACETAMINOPHEN 5-325 MG PO TABS
ORAL_TABLET | ORAL | Status: AC
  Administered 2010-10-08: 1 via ORAL
  Filled 2010-10-08: qty 1

## 2010-10-08 MED ORDER — ONDANSETRON HCL 4 MG/2ML IJ SOLN
INTRAMUSCULAR | Status: AC
  Filled 2010-10-08: qty 2

## 2010-10-08 MED ORDER — GLYCOPYRROLATE 0.2 MG/ML IJ SOLN
INTRAMUSCULAR | Status: DC | PRN
  Administered 2010-10-08: .4 mg via INTRAVENOUS
  Administered 2010-10-08: 0.2 mg via INTRAVENOUS

## 2010-10-08 MED ORDER — MEPERIDINE HCL 25 MG/ML IJ SOLN: 6.2500 mg | INTRAMUSCULAR | Status: DC | PRN

## 2010-10-08 MED ORDER — FENTANYL CITRATE 0.05 MG/ML IJ SOLN
INTRAMUSCULAR | Status: DC | PRN
  Administered 2010-10-08: 50 ug via INTRAVENOUS
  Administered 2010-10-08 (×2): 100 ug via INTRAVENOUS

## 2010-10-08 MED ORDER — GLYCOPYRROLATE 0.2 MG/ML IJ SOLN
INTRAMUSCULAR | Status: AC
  Filled 2010-10-08: qty 1

## 2010-10-08 MED ORDER — METOCLOPRAMIDE HCL 5 MG/ML IJ SOLN: 10.0000 mg | Freq: Once | INTRAMUSCULAR | Status: DC | PRN

## 2010-10-08 MED ORDER — MIDAZOLAM HCL 2 MG/2ML IJ SOLN
INTRAMUSCULAR | Status: AC
  Filled 2010-10-08: qty 2

## 2010-10-08 MED ORDER — DEXAMETHASONE SODIUM PHOSPHATE 10 MG/ML IJ SOLN
INTRAMUSCULAR | Status: AC
  Filled 2010-10-08: qty 1

## 2010-10-08 MED ORDER — OXYCODONE-ACETAMINOPHEN 5-325 MG PO TABS
1.0000 | ORAL_TABLET | ORAL | Status: DC | PRN
  Administered 2010-10-08: 1 via ORAL

## 2010-10-08 MED ORDER — BUPIVACAINE HCL (PF) 0.5 % IJ SOLN
INTRAMUSCULAR | Status: DC | PRN
  Administered 2010-10-08: 30 mL

## 2010-10-08 MED ORDER — FENTANYL CITRATE 0.05 MG/ML IJ SOLN
INTRAMUSCULAR | Status: AC
  Administered 2010-10-08: 25 ug via INTRAVENOUS
  Filled 2010-10-08: qty 2

## 2010-10-08 MED ORDER — LIDOCAINE HCL (CARDIAC) 20 MG/ML IV SOLN
INTRAVENOUS | Status: AC
  Filled 2010-10-08: qty 5

## 2010-10-08 MED ORDER — KETOROLAC TROMETHAMINE 30 MG/ML IJ SOLN
INTRAMUSCULAR | Status: DC | PRN
  Administered 2010-10-08: 30 mg via INTRAVENOUS

## 2010-10-08 MED ORDER — MIDAZOLAM HCL 5 MG/5ML IJ SOLN
INTRAMUSCULAR | Status: DC | PRN
  Administered 2010-10-08: 2 mg via INTRAVENOUS

## 2010-10-08 MED ORDER — PROPOFOL 10 MG/ML IV EMUL
INTRAVENOUS | Status: DC | PRN
  Administered 2010-10-08: 200 mg via INTRAVENOUS

## 2010-10-08 MED ORDER — OXYCODONE-ACETAMINOPHEN 5-325 MG PO TABS: 1.0000 | ORAL_TABLET | ORAL | Status: AC | PRN

## 2010-10-08 MED ORDER — PROPOFOL 10 MG/ML IV EMUL
INTRAVENOUS | Status: AC
  Filled 2010-10-08: qty 20

## 2010-10-08 MED ORDER — NEOSTIGMINE METHYLSULFATE 1 MG/ML IJ SOLN
INTRAMUSCULAR | Status: DC | PRN
  Administered 2010-10-08: 2.5 mg via INTRAVENOUS
  Administered 2010-10-08: 1 mg via INTRAVENOUS

## 2010-10-08 MED ORDER — FENTANYL CITRATE 0.05 MG/ML IJ SOLN
INTRAMUSCULAR | Status: AC
  Filled 2010-10-08: qty 5

## 2010-10-08 MED ORDER — DEXAMETHASONE SODIUM PHOSPHATE 4 MG/ML IJ SOLN
INTRAMUSCULAR | Status: DC | PRN
  Administered 2010-10-08: 10 mg via INTRAVENOUS

## 2010-10-08 SURGICAL SUPPLY — 19 items
CABLE HIGH FREQUENCY MONO STRZ (ELECTRODE) IMPLANT
CATH ROBINSON RED A/P 16FR (CATHETERS) ×2 IMPLANT
CHLORAPREP W/TINT 26ML (MISCELLANEOUS) ×2 IMPLANT
CLIP FILSHIE TUBAL LIGA STRL (Clip) ×2 IMPLANT
CLOTH BEACON ORANGE TIMEOUT ST (SAFETY) ×2 IMPLANT
ELECT REM PT RETURN 9FT ADLT (ELECTROSURGICAL)
ELECTRODE REM PT RTRN 9FT ADLT (ELECTROSURGICAL) IMPLANT
GLOVE BIO SURGEON STRL SZ 6.5 (GLOVE) ×4 IMPLANT
GOWN PREVENTION PLUS LG XLONG (DISPOSABLE) ×4 IMPLANT
NDL SAFETY ECLIPSE 18X1.5 (NEEDLE) ×1 IMPLANT
NEEDLE HYPO 18GX1.5 SHARP (NEEDLE) ×1
NEEDLE INSUFFLATION 14GA 120MM (NEEDLE) ×2 IMPLANT
PACK LAPAROSCOPY BASIN (CUSTOM PROCEDURE TRAY) ×2 IMPLANT
SUT VICRYL 0 UR6 27IN ABS (SUTURE) IMPLANT
SUT VICRYL 4-0 PS2 18IN ABS (SUTURE) ×2 IMPLANT
TOWEL OR 17X24 6PK STRL BLUE (TOWEL DISPOSABLE) ×4 IMPLANT
TROCAR XCEL NON-BLD 11X100MML (ENDOMECHANICALS) IMPLANT
TROCAR XCEL NON-BLD 5MMX100MML (ENDOMECHANICALS) IMPLANT
WATER STERILE IRR 1000ML POUR (IV SOLUTION) ×2 IMPLANT

## 2010-10-08 NOTE — Anesthesia Preprocedure Evaluation (Addendum)
Anesthesia Evaluation  Name, MR# and DOB Patient awake  General Assessment Comment  Reviewed: Allergy & Precautions, H&P , NPO status , Patient's Chart, lab work & pertinent test results  Airway Mallampati: II TM Distance: >3 FB Neck ROM: full    Dental No notable dental hx. (+) Teeth Intact   Pulmonary  clear to auscultation  Pulmonary exam normal       Cardiovascular regular Normal    Neuro/Psych Negative Neurological ROS  Negative Psych ROS   GI/Hepatic negative GI ROS Neg liver ROS    Endo/Other  Negative Endocrine ROS  Renal/GU negative Renal ROS  Genitourinary negative   Musculoskeletal   Abdominal   Peds  Hematology negative hematology ROS (+)   Anesthesia Other Findings   Reproductive/Obstetrics negative OB ROS                           Anesthesia Physical Anesthesia Plan  ASA: I  Anesthesia Plan: General   Post-op Pain Management:    Induction:   Airway Management Planned:   Additional Equipment:   Intra-op Plan:   Post-operative Plan:   Informed Consent: I have reviewed the patients History and Physical, chart, labs and discussed the procedure including the risks, benefits and alternatives for the proposed anesthesia with the patient or authorized representative who has indicated his/her understanding and acceptance.   Dental Advisory Given  Plan Discussed with: Anesthesiologist and CRNA  Anesthesia Plan Comments:        Anesthesia Quick Evaluation

## 2010-10-08 NOTE — H&P (Signed)
Patient ID: Debra Dominguez, female DOB: 22-Jun-1980, 30 y.o. MRN: [id number]  [id number] No LMP recorded. Patient is not currently having periods (Reason: IUD).  Requesting to schedule BTL. The procedure for laparoscopic BTL was explained and the risks. Questions were answered. She signed Medicaid papers 7/12 at [facility], Dr. Edmonia James  Past Medical History   Diagnosis  Date   .  Cervical cancer  1998     had leep procedure- yearly pap smears now   .  Gallstones     Past Surgical History   Procedure  Date   .  Gallbladder surgery    .  Cholecystectomy     No Known Allergies.  No current outpatient prescriptions on file prior to visit.    History    Social History   .  Marital Status:  Married     Spouse Name:  N/A     Number of Children:  N/A   .  Years of Education:  N/A    Occupational History   .  Not on file.    Social History Main Topics   .  Smoking status:  Current Some Day Smoker -- 0.3 packs/day     Types:  Cigarettes   .  Smokeless tobacco:  Never Used   .  Alcohol Use:  Yes      1 drink per month   .  Drug Use:  No   .  Sexually Active:  Yes     Birth Control/ Protection:  IUD      1 partner- IUD needs to be replaced in 2013    Other Topics  Concern   .  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    Review of Systemsamenorrhea, no vaginal or bladder sx   Filed Vitals:    09/17/10 1517   BP:  128/77   Pulse:  96   Temp:  98.3 F (36.8 C)   TempSrc:  Oral   Height:  5\' 7"  (1.702 m)   Weight:  187 lb (84.823 kg)    Objective:   Physical Exam  Constitutional: She appears well-developed and well-nourished.  Cardiovascular: Normal rate and normal heart sounds.  Pulmonary/Chest: Breath sounds normal.  Abdominal: Soft. She exhibits no mass. There is no tenderness.  Genitourinary:  Deferred  Skin: Skin is warm and dry.  Psychiatric: She has a normal mood and affect.    Assessment & Plan:     Requests sterilization. Schedule BTL. She will get her IUD removed at her 6 week post op check versus removing it when it expires next year (due to her history of menorrhagia).  She will decide at her post op visit.

## 2010-10-08 NOTE — Anesthesia Postprocedure Evaluation (Signed)
  Anesthesia Post-op Note  Patient: Debra Dominguez  Procedure(s) Performed:  LAPAROSCOPIC TUBAL LIGATION  Patient is awake and responsive. Pain and nausea are reasonably well controlled. Vital signs are stable and clinically acceptable. Oxygen saturation is clinically acceptable. There are no apparent anesthetic complications at this time. Patient is ready for discharge.

## 2010-10-08 NOTE — Op Note (Signed)
10/08/2010  2:52 PM  PATIENT:  Debra Dominguez  30 y.o. female  PRE-OPERATIVE DIAGNOSIS:  Desires Sterilization   POST-OPERATIVE DIAGNOSIS:  Desires Sterilization   PROCEDURE:  Procedure(s): LAPAROSCOPIC TUBAL LIGATION  SURGEON:  Surgeon(s): Kayte Borchard C. Marice Potter, MD  PHYSICIAN ASSISTANT:   ASSISTANTS: none   ANESTHESIA:   general  OR FLUID I/O:     BLOOD ADMINISTERED:none  DRAINS: none   LOCAL MEDICATIONS USED:  MARCAINE 30CC  SPECIMEN:  No Specimen  DISPOSITION OF SPECIMEN:  N/A  COUNTS:  YES  TOURNIQUET:  * No tourniquets in log *  DICTATION: .Dragon Dictation  PLAN OF CARE: Discharge to home after PACU  PATIENT DISPOSITION:  PACU - hemodynamically stable.   Delay start of Pharmacological VTE agent (>24hrs) due to surgical blood loss or risk of bleeding: No    The risks, benefits, and alternatives of surgery were explained, understood, and accepted. Consents were signed. All questions were answered. After a discussion, she has decided to leave her IUD in and told her six-week postop check when it can be removed in the office.she was taken to the operating room and general anesthesia was applied without complication. She was placed in the dorsal lithotomy position. Her vagina and abdomen were prepped and draped in the usual sterile fashion. A bimanual exam revealed a normal size and shape anteverted mobile uterus. Her adnexa are not enlarged. Her bladder was emptied with a Robinson catheter for approximately 50 mL of urine. Gloves were changed and attention was turned to the abdomen. A vertical umbilical incision was made after injecting 20 mL of 0.5% Marcaine into the subcutaneous tissue at the umbilicus. A varies needle was placed intraperitoneally. Low-flow CO2 was used to insufflate the abdomen to approximately 4 L. After a good pneumoperitoneum was established, an 11 mm Excell trocar was placed. She was placed in Trendelenburg position and laparoscopy confirmed correct  placement. The ovaries and tubes were inspected bilaterally and appeared normal. A Filshie clip was placed in the isthmic region of each oviduct. Hemostasis was noted. The CO2 was allowed to escape from the abdomen. The port was removed and a subcuticular closure was done with 4-0 Vicryl suture. Please note that prior to placing the Hulka manipulator on the cervix, I gave her a paracervical block with 10 mL of 0.5% Marcaine. She tolerated the procedure well and was taken to recovery room in stable condition.

## 2010-10-08 NOTE — Transfer of Care (Signed)
Immediate Anesthesia Transfer of Care Note  Patient: Debra Dominguez  Procedure(s) Performed:  LAPAROSCOPIC TUBAL LIGATION  Patient Location: PACU  Anesthesia Type: General  Level of Consciousness: awake, alert  and oriented  Airway & Oxygen Therapy: Patient Spontanous Breathing and Patient connected to nasal cannula oxygen  Post-op Assessment: Report given to PACU RN and Post -op Vital signs reviewed and stable  Post vital signs: stable  Complications: No apparent anesthesia complications

## 2010-10-12 ENCOUNTER — Encounter (HOSPITAL_COMMUNITY): Payer: Self-pay | Admitting: Obstetrics & Gynecology

## 2010-10-15 ENCOUNTER — Ambulatory Visit: Payer: Medicaid Other | Admitting: Obstetrics & Gynecology

## 2010-11-05 ENCOUNTER — Encounter: Payer: Self-pay | Admitting: Obstetrics & Gynecology

## 2010-11-05 ENCOUNTER — Ambulatory Visit (INDEPENDENT_AMBULATORY_CARE_PROVIDER_SITE_OTHER): Payer: Medicaid Other | Admitting: Obstetrics & Gynecology

## 2010-11-05 VITALS — BP 131/75 | HR 100 | Temp 97.6°F | Ht 68.0 in | Wt 193.6 lb

## 2010-11-05 DIAGNOSIS — Z09 Encounter for follow-up examination after completed treatment for conditions other than malignant neoplasm: Secondary | ICD-10-CM

## 2010-11-05 DIAGNOSIS — Z9889 Other specified postprocedural states: Secondary | ICD-10-CM

## 2010-11-05 NOTE — Progress Notes (Signed)
  Subjective:    Patient ID: Debra Dominguez, female    DOB: 17-May-1980, 30 y.o.   MRN: [id number]  HPI  Ms. Strength is here for a post op check and Mirena removal. She had a laproscopic tubal 10-08-10.  She has no complaints. Review of Systems     Objective:   Physical Exam  Incision healed great  Mirena removed easily    Assessment & Plan:  Post op doing well. RTC for annual. She declines a flu vaccine today.

## 2011-10-10 ENCOUNTER — Emergency Department (HOSPITAL_COMMUNITY)
Admission: EM | Admit: 2011-10-10 | Discharge: 2011-10-10 | Disposition: A | Discharge status: Home / Self Care | Payer: Medicaid Other | Source: Home / Self Care | Attending: Emergency Medicine | Admitting: Emergency Medicine

## 2011-10-10 ENCOUNTER — Encounter (HOSPITAL_COMMUNITY): Payer: Self-pay | Admitting: *Deleted

## 2011-10-10 DIAGNOSIS — IMO0002 Reserved for concepts with insufficient information to code with codable children: Secondary | ICD-10-CM

## 2011-10-10 DIAGNOSIS — T148XXA Other injury of unspecified body region, initial encounter: Secondary | ICD-10-CM

## 2011-10-10 MED ORDER — TRAMADOL HCL 50 MG PO TABS: 100.0000 mg | ORAL_TABLET | Freq: Three times a day (TID) | ORAL | Status: DC | PRN

## 2011-10-10 NOTE — ED Provider Notes (Signed)
Chief Complaint  Patient presents with  . Finger Injury    History of Present Illness:   The patient is a 31 year old female who partially detached her left thumbnail today while at home. She was opening a window and struck her thumbnail against the window sill, bending or thumbnail back. It still attached at the base but very tender to touch. She able to move the thumb well and denies any numbness or tingling.  Review of Systems:  Other than noted above, the patient denies any of the following symptoms: Systemic:  No fevers, chills, sweats, or aches.  No fatigue or tiredness. Musculoskeletal:  No joint pain, arthritis, bursitis, swelling, back pain, or neck pain. Neurological:  No muscular weakness, paresthesias, headache, or trouble with speech or coordination.  No dizziness.   PMFSH:  Past medical history, family history, social history, meds, and allergies were reviewed.  Physical Exam:   Vital signs:  BP 131/76  Pulse 96  Temp 98.1 F (36.7 C) (Oral)  Resp 18  SpO2 100%  LMP 09/30/2011 Gen:  Alert and oriented times 3.  In no distress. Musculoskeletal: The nail was very loose and tender to touch. Otherwise there was no redness, swelling, or bruising. Otherwise, all joints had a full a ROM with no swelling, bruising or deformity.  No edema, pulses full. Extremities were warm and pink.  Capillary refill was brisk.  Skin:  Clear, warm and dry.  No rash. Neuro:  Alert and oriented times 3.  Muscle strength was normal.  Sensation was intact to light touch.   Procedure Note:  Verbal informed consent was obtained from the patient.  Risks and benefits were outlined with the patient.  Patient understands and accepts these risks.  Identity of the patient was confirmed verbally and by armband.    Procedure was performed as followed:  The nail was prepped with alcohol and anesthetized with a digital block with 5 mL of 2% Xylocaine without epinephrine. Thereafter satisfactory anesthesia was  documented. The nail then was prepped with alcohol and elevated with a nail elevator. The nail was grasped with a hemostat and avulsed completely. There was minimal bleeding and no evidence of infection. Antibiotic ointment was applied to the nailbed followed by a nonstick dressing and a light pressure dressing. The patient is to leave this in place for 24 hours, then remove it and start care at home. She was instructed to return if there is any sign of infection.  Patient tolerated the procedure well without any immediate complications.   Assessment:  The encounter diagnosis was Nail avulsion.  Plan:   1.  The following meds were prescribed:   New Prescriptions   TRAMADOL (ULTRAM) 50 MG TABLET    Take 2 tablets (100 mg total) by mouth every 8 (eight) hours as needed for pain.   2.  The patient was instructed in symptomatic care, including rest and activity, elevation, application of ice and compression.  Appropriate handouts were given. She was instructed in wound care. 3.  The patient was told to return if becoming worse in any way, if no better in 3 or 4 days, and given some red flag symptoms that would indicate earlier return.   4.  The patient was told to follow up if any sign of infection.   Reuben Likes, MD 10/10/11 2004

## 2011-10-10 NOTE — ED Notes (Signed)
Pt reports bending nail off of finger while shutting door

## 2011-11-10 ENCOUNTER — Encounter: Payer: Medicaid Other | Admitting: Family Medicine

## 2011-11-11 ENCOUNTER — Encounter: Payer: Medicaid Other | Admitting: Family Medicine

## 2011-12-21 ENCOUNTER — Encounter: Payer: Medicaid Other | Admitting: Family Medicine

## 2011-12-31 ENCOUNTER — Encounter: Payer: Self-pay | Admitting: Family Medicine

## 2011-12-31 ENCOUNTER — Other Ambulatory Visit (HOSPITAL_COMMUNITY)
Admission: RE | Admit: 2011-12-31 | Discharge: 2011-12-31 | Disposition: A | Discharge status: Home / Self Care | Payer: Medicaid Other | Source: Ambulatory Visit | Attending: Family Medicine | Admitting: Family Medicine

## 2011-12-31 ENCOUNTER — Ambulatory Visit (INDEPENDENT_AMBULATORY_CARE_PROVIDER_SITE_OTHER): Payer: Medicaid Other | Admitting: Family Medicine

## 2011-12-31 DIAGNOSIS — Z113 Encounter for screening for infections with a predominantly sexual mode of transmission: Secondary | ICD-10-CM | POA: Insufficient documentation

## 2011-12-31 DIAGNOSIS — Z01419 Encounter for gynecological examination (general) (routine) without abnormal findings: Secondary | ICD-10-CM | POA: Insufficient documentation

## 2011-12-31 DIAGNOSIS — Z1151 Encounter for screening for human papillomavirus (HPV): Secondary | ICD-10-CM | POA: Insufficient documentation

## 2011-12-31 DIAGNOSIS — N760 Acute vaginitis: Secondary | ICD-10-CM

## 2011-12-31 DIAGNOSIS — Z0000 Encounter for general adult medical examination without abnormal findings: Secondary | ICD-10-CM

## 2011-12-31 DIAGNOSIS — Z124 Encounter for screening for malignant neoplasm of cervix: Secondary | ICD-10-CM

## 2011-12-31 DIAGNOSIS — F17200 Nicotine dependence, unspecified, uncomplicated: Secondary | ICD-10-CM

## 2011-12-31 DIAGNOSIS — E663 Overweight: Secondary | ICD-10-CM

## 2011-12-31 NOTE — Patient Instructions (Addendum)
It was great to meet you today, Debra Dominguez. We will call or send a letter with recent lab results. Return to clinic in one year for annual physical or sooner as needed. Have a happy New Year and good luck with your BoFlex machine!  Preventive Care for Adults, Female A healthy lifestyle and preventive care can promote health and wellness. Preventive health guidelines for women include the following key practices.  A routine yearly physical is a good way to check with your caregiver about your health and preventive screening. It is a chance to share any concerns and updates on your health, and to receive a thorough exam.  Visit your dentist for a routine exam and preventive care every 6 months. Brush your teeth twice a day and floss once a day. Good oral hygiene prevents tooth decay and gum disease.  The frequency of eye exams is based on your age, health, family medical history, use of contact lenses, and other factors. Follow your caregiver's recommendations for frequency of eye exams.  Eat a healthy diet. Foods like vegetables, fruits, whole grains, low-fat dairy products, and lean protein foods contain the nutrients you need without too many calories. Decrease your intake of foods high in solid fats, added sugars, and salt. Eat the right amount of calories for you.Get information about a proper diet from your caregiver, if necessary.  Regular physical exercise is one of the most important things you can do for your health. Most adults should get at least 150 minutes of moderate-intensity exercise (any activity that increases your heart rate and causes you to sweat) each week. In addition, most adults need muscle-strengthening exercises on 2 or more days a week.  Maintain a healthy weight. The body mass index (BMI) is a screening tool to identify possible weight problems. It provides an estimate of body fat based on height and weight. Your caregiver can help determine your BMI, and can help you  achieve or maintain a healthy weight.For adults 20 years and older:  A BMI below 18.5 is considered underweight.  A BMI of 18.5 to 24.9 is normal.  A BMI of 25 to 29.9 is considered overweight.  A BMI of 30 and above is considered obese.  Maintain normal blood lipids and cholesterol levels by exercising and minimizing your intake of saturated fat. Eat a balanced diet with plenty of fruit and vegetables. Blood tests for lipids and cholesterol should begin at age 80 and be repeated every 5 years. If your lipid or cholesterol levels are high, you are over 50, or you are at high risk for heart disease, you may need your cholesterol levels checked more frequently.Ongoing high lipid and cholesterol levels should be treated with medicines if diet and exercise are not effective.  If you smoke, find out from your caregiver how to quit. If you do not use tobacco, do not start.  If you are pregnant, do not drink alcohol. If you are breastfeeding, be very cautious about drinking alcohol. If you are not pregnant and choose to drink alcohol, do not exceed 1 drink per day. One drink is considered to be 12 ounces (355 mL) of beer, 5 ounces (148 mL) of wine, or 1.5 ounces (44 mL) of liquor.  Avoid use of street drugs. Do not share needles with anyone. Ask for help if you need support or instructions about stopping the use of drugs.  High blood pressure causes heart disease and increases the risk of stroke. Your blood pressure should be checked at  least every 1 to 2 years. Ongoing high blood pressure should be treated with medicines if weight loss and exercise are not effective.  If you are 26 to 31 years old, ask your caregiver if you should take aspirin to prevent strokes.  Diabetes screening involves taking a blood sample to check your fasting blood sugar level. This should be done once every 3 years, after age 27, if you are within normal weight and without risk factors for diabetes. Testing should be  considered at a younger age or be carried out more frequently if you are overweight and have at least 1 risk factor for diabetes.  Breast cancer screening is essential preventive care for women. You should practice "breast self-awareness." This means understanding the normal appearance and feel of your breasts and may include breast self-examination. Any changes detected, no matter how small, should be reported to a caregiver. Women in their 18s and 30s should have a clinical breast exam (CBE) by a caregiver as part of a regular health exam every 1 to 3 years. After age 16, women should have a CBE every year. Starting at age 71, women should consider having a mammography (breast X-ray test) every year. Women who have a family history of breast cancer should talk to their caregiver about genetic screening. Women at a high risk of breast cancer should talk to their caregivers about having magnetic resonance imaging (MRI) and a mammography every year.  The Pap test is a screening test for cervical cancer. A Pap test can show cell changes on the cervix that might become cervical cancer if left untreated. A Pap test is a procedure in which cells are obtained and examined from the lower end of the uterus (cervix).  Women should have a Pap test starting at age 76.  Between ages 90 and 7, Pap tests should be repeated every 2 years.  Beginning at age 68, you should have a Pap test every 3 years as long as the past 3 Pap tests have been normal.  Some women have medical problems that increase the chance of getting cervical cancer. Talk to your caregiver about these problems. It is especially important to talk to your caregiver if a new problem develops soon after your last Pap test. In these cases, your caregiver may recommend more frequent screening and Pap tests.  The above recommendations are the same for women who have or have not gotten the vaccine for human papillomavirus (HPV).  If you had a  hysterectomy for a problem that was not cancer or a condition that could lead to cancer, then you no longer need Pap tests. Even if you no longer need a Pap test, a regular exam is a good idea to make sure no other problems are starting.  If you are between ages 82 and 68, and you have had normal Pap tests going back 10 years, you no longer need Pap tests. Even if you no longer need a Pap test, a regular exam is a good idea to make sure no other problems are starting.  If you have had past treatment for cervical cancer or a condition that could lead to cancer, you need Pap tests and screening for cancer for at least 20 years after your treatment.  If Pap tests have been discontinued, risk factors (such as a new sexual partner) need to be reassessed to determine if screening should be resumed.  The HPV test is an additional test that may be used for cervical cancer  screening. The HPV test looks for the virus that can cause the cell changes on the cervix. The cells collected during the Pap test can be tested for HPV. The HPV test could be used to screen women aged 17 years and older, and should be used in women of any age who have unclear Pap test results. After the age of 67, women should have HPV testing at the same frequency as a Pap test.  Colorectal cancer can be detected and often prevented. Most routine colorectal cancer screening begins at the age of 62 and continues through age 60. However, your caregiver may recommend screening at an earlier age if you have risk factors for colon cancer. On a yearly basis, your caregiver may provide home test kits to check for hidden blood in the stool. Use of a small camera at the end of a tube, to directly examine the colon (sigmoidoscopy or colonoscopy), can detect the earliest forms of colorectal cancer. Talk to your caregiver about this at age 7, when routine screening begins. Direct examination of the colon should be repeated every 5 to 10 years through age  72, unless early forms of pre-cancerous polyps or small growths are found.  Hepatitis C blood testing is recommended for all people born from [birth year] through 1965 and any individual with known risks for hepatitis C.  Practice safe sex. Use condoms and avoid high-risk sexual practices to reduce the spread of sexually transmitted infections (STIs). STIs include gonorrhea, chlamydia, syphilis, trichomonas, herpes, HPV, and human immunodeficiency virus (HIV). Herpes, HIV, and HPV are viral illnesses that have no cure. They can result in disability, cancer, and death. Sexually active women aged 33 and younger should be checked for chlamydia. Older women with new or multiple partners should also be tested for chlamydia. Testing for other STIs is recommended if you are sexually active and at increased risk.  Osteoporosis is a disease in which the bones lose minerals and strength with aging. This can result in serious bone fractures. The risk of osteoporosis can be identified using a bone density scan. Women ages 63 and over and women at risk for fractures or osteoporosis should discuss screening with their caregivers. Ask your caregiver whether you should take a calcium supplement or vitamin D to reduce the rate of osteoporosis.  Menopause can be associated with physical symptoms and risks. Hormone replacement therapy is available to decrease symptoms and risks. You should talk to your caregiver about whether hormone replacement therapy is right for you.  Use sunscreen with sun protection factor (SPF) of 30 or more. Apply sunscreen liberally and repeatedly throughout the day. You should seek shade when your shadow is shorter than you. Protect yourself by wearing long sleeves, pants, a wide-brimmed hat, and sunglasses year round, whenever you are outdoors.  Once a month, do a whole body skin exam, using a mirror to look at the skin on your back. Notify your caregiver of new moles, moles that have irregular borders,  moles that are larger than a pencil eraser, or moles that have changed in shape or color.  Stay current with required immunizations.  Influenza. You need a dose every fall (or winter). The composition of the flu vaccine changes each year, so being vaccinated once is not enough.  Pneumococcal polysaccharide. You need 1 to 2 doses if you smoke cigarettes or if you have certain chronic medical conditions. You need 1 dose at age 24 (or older) if you have never been vaccinated.  Tetanus, diphtheria, pertussis (  Tdap, Td). Get 1 dose of Tdap vaccine if you are younger than age 74, are over 9 and have contact with an infant, are a Research scientist (physical sciences), are pregnant, or simply want to be protected from whooping cough. After that, you need a Td booster dose every 10 years. Consult your caregiver if you have not had at least 3 tetanus and diphtheria-containing shots sometime in your life or have a deep or dirty wound.  HPV. You need this vaccine if you are a woman age 42 or younger. The vaccine is given in 3 doses over 6 months.  Measles, mumps, rubella (MMR). You need at least 1 dose of MMR if you were born in 1957 or later. You may also need a second dose.  Meningococcal. If you are age 76 to 49 and a first-year college student living in a residence hall, or have one of several medical conditions, you need to get vaccinated against meningococcal disease. You may also need additional booster doses.  Zoster (shingles). If you are age 106 or older, you should get this vaccine.  Varicella (chickenpox). If you have never had chickenpox or you were vaccinated but received only 1 dose, talk to your caregiver to find out if you need this vaccine.  Hepatitis A. You need this vaccine if you have a specific risk factor for hepatitis A virus infection or you simply wish to be protected from this disease. The vaccine is usually given as 2 doses, 6 to 18 months apart.  Hepatitis B. You need this vaccine if you have a  specific risk factor for hepatitis B virus infection or you simply wish to be protected from this disease. The vaccine is given in 3 doses, usually over 6 months. Preventive Services / Frequency Ages 28 to 79  Blood pressure check.** / Every 1 to 2 years.  Lipid and cholesterol check.** / Every 5 years beginning at age 3.  Clinical breast exam.** / Every 3 years for women in their 65s and 30s.  Pap test.** / Every 2 years from ages 55 through 78. Every 3 years starting at age 68 through age 73 or 73 with a history of 3 consecutive normal Pap tests.  HPV screening.** / Every 3 years from ages 51 through ages 48 to 60 with a history of 3 consecutive normal Pap tests.  Hepatitis C blood test.** / For any individual with known risks for hepatitis C.  Skin self-exam. / Monthly.  Influenza immunization.** / Every year.  Pneumococcal polysaccharide immunization.** / 1 to 2 doses if you smoke cigarettes or if you have certain chronic medical conditions.  Tetanus, diphtheria, pertussis (Tdap, Td) immunization. / A one-time dose of Tdap vaccine. After that, you need a Td booster dose every 10 years.  HPV immunization. / 3 doses over 6 months, if you are 27 and younger.  Measles, mumps, rubella (MMR) immunization. / You need at least 1 dose of MMR if you were born in 1957 or later. You may also need a second dose.  Meningococcal immunization. / 1 dose if you are age 66 to 24 and a first-year college student living in a residence hall, or have one of several medical conditions, you need to get vaccinated against meningococcal disease. You may also need additional booster doses.  Varicella immunization.** / Consult your caregiver.  Hepatitis A immunization.** / Consult your caregiver. 2 doses, 6 to 18 months apart.  Hepatitis B immunization.** / Consult your caregiver. 3 doses usually over 6 months. Ages  40 to 64  Blood pressure check.** / Every 1 to 2 years.  Lipid and cholesterol  check.** / Every 5 years beginning at age 107.  Clinical breast exam.** / Every year after age 35.  Mammogram.** / Every year beginning at age 22 and continuing for as long as you are in good health. Consult with your caregiver.  Pap test.** / Every 3 years starting at age 39 through age 32 or 30 with a history of 3 consecutive normal Pap tests.  HPV screening.** / Every 3 years from ages 26 through ages 14 to 43 with a history of 3 consecutive normal Pap tests.  Fecal occult blood test (FOBT) of stool. / Every year beginning at age 79 and continuing until age 88. You may not need to do this test if you get a colonoscopy every 10 years.  Flexible sigmoidoscopy or colonoscopy.** / Every 5 years for a flexible sigmoidoscopy or every 10 years for a colonoscopy beginning at age 45 and continuing until age 4.  Hepatitis C blood test.** / For all people born from [birth year] through 1965 and any individual with known risks for hepatitis C.  Skin self-exam. / Monthly.  Influenza immunization.** / Every year.  Pneumococcal polysaccharide immunization.** / 1 to 2 doses if you smoke cigarettes or if you have certain chronic medical conditions.  Tetanus, diphtheria, pertussis (Tdap, Td) immunization.** / A one-time dose of Tdap vaccine. After that, you need a Td booster dose every 10 years.  Measles, mumps, rubella (MMR) immunization. / You need at least 1 dose of MMR if you were born in 1957 or later. You may also need a second dose.  Varicella immunization.** / Consult your caregiver.  Meningococcal immunization.** / Consult your caregiver.  Hepatitis A immunization.** / Consult your caregiver. 2 doses, 6 to 18 months apart.  Hepatitis B immunization.** / Consult your caregiver. 3 doses, usually over 6 months. Ages 27 and over  Blood pressure check.** / Every 1 to 2 years.  Lipid and cholesterol check.** / Every 5 years beginning at age 36.  Clinical breast exam.** / Every year after age  2.  Mammogram.** / Every year beginning at age 59 and continuing for as long as you are in good health. Consult with your caregiver.  Pap test.** / Every 3 years starting at age 23 through age 55 or 64 with a 3 consecutive normal Pap tests. Testing can be stopped between 65 and 70 with 3 consecutive normal Pap tests and no abnormal Pap or HPV tests in the past 10 years.  HPV screening.** / Every 3 years from ages 53 through ages 58 or 82 with a history of 3 consecutive normal Pap tests. Testing can be stopped between 65 and 70 with 3 consecutive normal Pap tests and no abnormal Pap or HPV tests in the past 10 years.  Fecal occult blood test (FOBT) of stool. / Every year beginning at age 49 and continuing until age 23. You may not need to do this test if you get a colonoscopy every 10 years.  Flexible sigmoidoscopy or colonoscopy.** / Every 5 years for a flexible sigmoidoscopy or every 10 years for a colonoscopy beginning at age 58 and continuing until age 98.  Hepatitis C blood test.** / For all people born from [birth year] through 1965 and any individual with known risks for hepatitis C.  Osteoporosis screening.** / A one-time screening for women ages 17 and over and women at risk for fractures or osteoporosis.  Skin  self-exam. / Monthly.  Influenza immunization.** / Every year.  Pneumococcal polysaccharide immunization.** / 1 dose at age 72 (or older) if you have never been vaccinated.  Tetanus, diphtheria, pertussis (Tdap, Td) immunization. / A one-time dose of Tdap vaccine if you are over 65 and have contact with an infant, are a Research scientist (physical sciences), or simply want to be protected from whooping cough. After that, you need a Td booster dose every 10 years.  Varicella immunization.** / Consult your caregiver.  Meningococcal immunization.** / Consult your caregiver.  Hepatitis A immunization.** / Consult your caregiver. 2 doses, 6 to 18 months apart.  Hepatitis B immunization.** / Check with  your caregiver. 3 doses, usually over 6 months. ** Family history and personal history of risk and conditions may change your caregiver's recommendations. Document Released: 02/16/2001 Document Revised: 03/15/2011 Document Reviewed: 05/18/2010 [facility] Patient Information 2013 [place], [place].

## 2011-12-31 NOTE — Assessment & Plan Note (Signed)
Quit smoking cold [place] after Thanksgiving (about one month ago).  She says she smokes one cigarette on the weekends with friends.   Encouraged complete cessation.

## 2011-12-31 NOTE — Progress Notes (Signed)
  Subjective:     Debra Dominguez is a 31 y.o. female and is here for a comprehensive physical exam. The patient reports no problems.  She has had a LEEP performed about 5 years ago and was told she needed annual pap smears.  She is sexually active, had a tubal ligation.  She wants to lose weight - walks daily and has a BoFlex machine at home.  She quit smoking about one month.   History   Social History  . Marital Status: Legally Separated    Spouse Name: N/A    Number of Children: N/A  . Years of Education: N/A   Occupational History  . Not on file.   Social History Main Topics  . Smoking status: Former Smoker -- 0.3 packs/day    Types: Cigarettes    Quit date: 12/02/2011  . Smokeless tobacco: Never Used  . Alcohol Use: 0.0 oz/week    0 Glasses of wine, 0 Drinks containing 0.5 oz of alcohol per week     Comment: 1 drink per month  . Drug Use: No  . Sexually Active: Yes    Birth Control/ Protection: IUD     Comment: 1 partner- IUD needs to be replaced in 2013   Other Topics Concern  . 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    Health Maintenance  Topic Date Due  . Pap Smear  12/26/1998  . Tetanus/tdap  12/26/1999  . Influenza Vaccine  09/05/2011    The following portions of the patient's history were reviewed and updated as appropriate: allergies, current medications, past family history, past medical history, past social history and problem list.  Review of Systems Pertinent items are noted in HPI.   Objective:    There were no vitals taken for this visit. General appearance: alert, cooperative, no distress and moderately obese Head: Normocephalic, without obvious abnormality, atraumatic Ears: normal TM's and external ear canals both ears Nose: Nares normal. Septum midline. Mucosa normal. No drainage or sinus tenderness. Throat: lips, mucosa, and tongue normal; teeth and gums normal Neck: no  adenopathy, supple, symmetrical, trachea midline and thyroid not enlarged, symmetric, no tenderness/mass/nodules Lungs: clear to auscultation bilaterally Heart: regular rate and rhythm, S1, S2 normal, no murmur, click, rub or gallop Abdomen: soft, non-tender; bowel sounds normal; no masses,  no organomegaly Pelvic: cervix normal in appearance, external genitalia normal, no adnexal masses or tenderness, no cervical motion tenderness, uterus normal size, shape, and consistency and vagina normal without discharge Extremities: extremities normal, atraumatic, no cyanosis or edema Skin: Skin color, texture, turgor normal. No rashes or lesions    Assessment:    Healthy female exam with GYN exam.     Plan:     See After Visit Summary for Counseling Recommendations

## 2011-12-31 NOTE — Assessment & Plan Note (Signed)
Patient seems very motivated to lose weight this upcoming new year.  Quit smoking about 1 month ago and is concerned about weight gain.  Her goal today is to cut back on sodas and use new BoFlex machine twice per day.  Follow up in one year for next CPE.

## 2011-12-31 NOTE — Assessment & Plan Note (Signed)
Will perform pap smear, GC/Chlamydia today.  If third pap normal today, will perform every 3 years. Will notify of results.

## 2012-01-06 ENCOUNTER — Encounter: Payer: Self-pay | Admitting: Family Medicine

## 2013-02-28 ENCOUNTER — Ambulatory Visit (INDEPENDENT_AMBULATORY_CARE_PROVIDER_SITE_OTHER): Payer: Medicaid Other | Admitting: Emergency Medicine

## 2013-02-28 ENCOUNTER — Encounter: Payer: Self-pay | Admitting: Emergency Medicine

## 2013-02-28 VITALS — BP 133/80 | HR 108 | Temp 99.4°F | Ht 68.0 in | Wt 199.0 lb

## 2013-02-28 DIAGNOSIS — J069 Acute upper respiratory infection, unspecified: Secondary | ICD-10-CM

## 2013-02-28 MED ORDER — FLUTICASONE PROPIONATE 50 MCG/ACT NA SUSP: 2.0000 | Freq: Every day | NASAL | Status: DC

## 2013-02-28 NOTE — Patient Instructions (Signed)
It was nice to meet you!  I think you have a virus and the dryness isn't helping. - Take the mucinex twice a day for the next 5 days. - I sent in Flonase.  Use it 2 sprays each nostril once a day. - Get some nasal saline spray.  Use it every 3-4 hours. - get a humidifer to put by your bed at night.  Follow up is not better in 1 week.

## 2013-02-28 NOTE — Assessment & Plan Note (Signed)
No signs of pneumonia. Symptomatic treatment as in AVS. F/u in 1 week if not improving.

## 2013-02-28 NOTE — Progress Notes (Signed)
   Subjective:    Patient ID: Particia JasperGlenda Chenette, female    DOB: 12/23/1980, 33 y.o.   MRN: [id number]  HPI Particia JasperGlenda Quincy is here for a SDA for cold.  She reports nasal congestion and scratchy/sore throat since last Thursday.  Also with cough, relieved by cough drops.  No fevers, chills, nausea or vomiting.  States she otherwise feels well.  States the throat and nose get worse at night.  Using vaseline in her nose.  Also tried nyquil, theraflu and mucinex.  Current Outpatient Prescriptions on File Prior to Visit  Medication Sig Dispense Refill  . traMADol (ULTRAM) 50 MG tablet Take 2 tablets (100 mg total) by mouth every 8 (eight) hours as needed for pain.  30 tablet  0   No current facility-administered medications on file prior to visit.    I have reviewed and updated the following as appropriate: allergies and current medications SHx: former smoker   Review of Systems See HPI    Objective:   Physical Exam BP 133/80  Pulse 108  Temp(Src) 99.4 F (37.4 C) (Oral)  Ht 5\' 8"  (1.727 m)  Wt 199 lb (90.266 kg)  BMI 30.26 kg/m2  LMP 02/21/2013 Gen: alert, cooperative, NAD HEENT: AT/[place], sclera white, MMM, mild cobblestoning, nasal mucosa mildly erythematous; TMs normal bilaterally; no sinus tenderness Neck: supple, no LAD CV: RRR, no murmurs Pulm: CTAB, no wheezes or rales      Assessment & Plan:

## 2013-11-05 ENCOUNTER — Encounter: Payer: Self-pay | Admitting: Emergency Medicine

## 2014-09-29 ENCOUNTER — Encounter (HOSPITAL_COMMUNITY): Payer: Self-pay | Admitting: Emergency Medicine

## 2014-09-29 ENCOUNTER — Emergency Department (HOSPITAL_COMMUNITY)
Admission: EM | Admit: 2014-09-29 | Discharge: 2014-09-29 | Disposition: A | Discharge status: Home / Self Care | Payer: Medicaid Other | Attending: Emergency Medicine | Admitting: Emergency Medicine

## 2014-09-29 ENCOUNTER — Emergency Department (HOSPITAL_COMMUNITY): Payer: Medicaid Other

## 2014-09-29 DIAGNOSIS — Z9049 Acquired absence of other specified parts of digestive tract: Secondary | ICD-10-CM | POA: Diagnosis not present

## 2014-09-29 DIAGNOSIS — Z7951 Long term (current) use of inhaled steroids: Secondary | ICD-10-CM | POA: Insufficient documentation

## 2014-09-29 DIAGNOSIS — N201 Calculus of ureter: Secondary | ICD-10-CM | POA: Insufficient documentation

## 2014-09-29 DIAGNOSIS — Z3202 Encounter for pregnancy test, result negative: Secondary | ICD-10-CM | POA: Diagnosis not present

## 2014-09-29 DIAGNOSIS — R109 Unspecified abdominal pain: Secondary | ICD-10-CM

## 2014-09-29 DIAGNOSIS — Z87891 Personal history of nicotine dependence: Secondary | ICD-10-CM | POA: Insufficient documentation

## 2014-09-29 LAB — CBC
HEMATOCRIT: 40.8 % (ref 36.0–46.0)
Hemoglobin: 13.9 g/dL (ref 12.0–15.0)
MCH: 28.7 pg (ref 26.0–34.0)
MCHC: 34.1 g/dL (ref 30.0–36.0)
MCV: 84.1 fL (ref 78.0–100.0)
Platelets: 327 10*3/uL (ref 150–400)
RBC: 4.85 MIL/uL (ref 3.87–5.11)
RDW: 12.6 % (ref 11.5–15.5)
WBC: 15.4 10*3/uL — ABNORMAL HIGH (ref 4.0–10.5)

## 2014-09-29 LAB — PREGNANCY, URINE: PREG TEST UR: NEGATIVE

## 2014-09-29 LAB — COMPREHENSIVE METABOLIC PANEL
ALT: 12 U/L — ABNORMAL LOW (ref 14–54)
AST: 24 U/L (ref 15–41)
Albumin: 4.3 g/dL (ref 3.5–5.0)
Alkaline Phosphatase: 64 U/L (ref 38–126)
Anion gap: 14 (ref 5–15)
BUN: 13 mg/dL (ref 6–20)
CHLORIDE: 105 mmol/L (ref 101–111)
CO2: 18 mmol/L — AB (ref 22–32)
Calcium: 9.2 mg/dL (ref 8.9–10.3)
Creatinine, Ser: 0.94 mg/dL (ref 0.44–1.00)
GFR calc non Af Amer: >60 mL/min (ref >60)
Glucose, Bld: 153 mg/dL — ABNORMAL HIGH (ref 65–99)
POTASSIUM: 3.9 mmol/L (ref 3.5–5.1)
SODIUM: 137 mmol/L (ref 135–145)
Total Bilirubin: 0.4 mg/dL (ref 0.3–1.2)
Total Protein: 6.9 g/dL (ref 6.5–8.1)

## 2014-09-29 LAB — URINALYSIS, ROUTINE W REFLEX MICROSCOPIC
BILIRUBIN URINE: NEGATIVE
Glucose, UA: NEGATIVE mg/dL
KETONES UR: NEGATIVE mg/dL
NITRITE: NEGATIVE
Protein, ur: 30 mg/dL — AB
SPECIFIC GRAVITY, URINE: 1.039 — AB (ref 1.005–1.030)
UROBILINOGEN UA: 0.2 mg/dL (ref 0.0–1.0)
pH: 6 (ref 5.0–8.0)

## 2014-09-29 LAB — URINE MICROSCOPIC-ADD ON

## 2014-09-29 MED ORDER — ONDANSETRON 4 MG PO TBDP
8.0000 mg | ORAL_TABLET | Freq: Once | ORAL | Status: AC
  Administered 2014-09-29: 8 mg via ORAL

## 2014-09-29 MED ORDER — MORPHINE SULFATE (PF) 4 MG/ML IV SOLN
6.0000 mg | Freq: Once | INTRAVENOUS | Status: AC
  Administered 2014-09-29: 6 mg via INTRAVENOUS
  Filled 2014-09-29: qty 2

## 2014-09-29 MED ORDER — GI COCKTAIL ~~LOC~~
30.0000 mL | Freq: Once | ORAL | Status: AC
  Administered 2014-09-29: 30 mL via ORAL
  Filled 2014-09-29: qty 30

## 2014-09-29 MED ORDER — OXYCODONE-ACETAMINOPHEN 5-325 MG PO TABS
1.0000 | ORAL_TABLET | Freq: Once | ORAL | Status: AC
Start: 2014-09-29 — End: 2014-09-29
  Administered 2014-09-29: 1 via ORAL

## 2014-09-29 MED ORDER — OXYCODONE-ACETAMINOPHEN 5-325 MG PO TABS: 1.0000 | ORAL_TABLET | ORAL | Status: AC | PRN

## 2014-09-29 MED ORDER — ONDANSETRON HCL 4 MG/2ML IJ SOLN
4.0000 mg | Freq: Once | INTRAMUSCULAR | Status: AC
  Administered 2014-09-29: 4 mg via INTRAVENOUS
  Filled 2014-09-29: qty 2

## 2014-09-29 MED ORDER — KETOROLAC TROMETHAMINE 30 MG/ML IJ SOLN
30.0000 mg | Freq: Once | INTRAMUSCULAR | Status: AC
  Administered 2014-09-29: 30 mg via INTRAVENOUS
  Filled 2014-09-29: qty 1

## 2014-09-29 MED ORDER — ONDANSETRON 4 MG PO TBDP
ORAL_TABLET | ORAL | Status: AC
  Filled 2014-09-29: qty 2

## 2014-09-29 MED ORDER — OXYCODONE-ACETAMINOPHEN 5-325 MG PO TABS
ORAL_TABLET | ORAL | Status: AC
  Filled 2014-09-29: qty 1

## 2014-09-29 MED ORDER — HYDROMORPHONE HCL 1 MG/ML IJ SOLN
1.0000 mg | Freq: Once | INTRAMUSCULAR | Status: AC
  Administered 2014-09-29: 1 mg via INTRAVENOUS
  Filled 2014-09-29: qty 1

## 2014-09-29 MED ORDER — ONDANSETRON 8 MG PO TBDP: 8.0000 mg | ORAL_TABLET | Freq: Three times a day (TID) | ORAL | Status: DC | PRN

## 2014-09-29 MED ORDER — IBUPROFEN 600 MG PO TABS
600.0000 mg | ORAL_TABLET | Freq: Three times a day (TID) | ORAL | Status: AC | PRN
Start: 2014-09-29 — End: ?

## 2014-09-29 NOTE — ED Provider Notes (Signed)
CSN: [id number]     Arrival date & time 09/29/14  0217 History   First MD Initiated Contact with Patient 09/29/14 0530     Chief Complaint  Patient presents with  . Flank Pain  . Abdominal Pain    HPI Patient reports developing acute onset left-sided abdominal and left-sided flank pain tonight.  She's never had pain and discomfort like this.  She reports nausea and vomiting.  She denies diarrhea.  No constipation.  No dysuria or urinary frequency.  No fevers or chills.  Denies hematemesis.  No history of kidney stones  Past Medical History  Diagnosis Date  . Gallstones   . No pertinent past medical history   . Abnormal Pap smear of cervix    Past Surgical History  Procedure Laterality Date  . Gallbladder surgery    . Cholecystectomy    . Leep  34 years old  . Dilation and curretage    . Laparoscopic tubal ligation  10/08/2010    Procedure: LAPAROSCOPIC TUBAL LIGATION;  Surgeon: Hollie Salk C. Marice Potter, MD;  Location: [facility];  Service: Gynecology;  Laterality: Bilateral;  . Dilation and curettage of uterus     No family history on file. Social History  Substance Use Topics  . Smoking status: Former Smoker -- 0.30 packs/day    Types: Cigarettes    Quit date: 12/02/2011  . Smokeless tobacco: Never Used  . Alcohol Use: 0.0 oz/week    0 Glasses of wine, 0 Standard drinks or equivalent per week     Comment: 1 drink per month   OB History    Gravida Para Term Preterm AB TAB SAB Ectopic Multiple Living   Review of Systems  All other systems reviewed and are negative.     Allergies  Review of patient's allergies indicates no known allergies.  Home Medications   Prior to Admission medications   Medication Sig Start Date End Date Taking? Authorizing Provider  fluticasone (FLONASE) 50 MCG/ACT nasal spray Place 2 sprays into both nostrils daily. 02/28/13   Charm Rings, MD  traMADol (ULTRAM) 50 MG tablet Take 2 tablets (100 mg total) by mouth every 8 (eight) hours  as needed for pain. 10/10/11   Reuben Likes, MD   BP 121/70 mmHg  Pulse 95  Temp(Src) 98.7 F (37.1 C) (Oral)  Resp 18  SpO2 99%  LMP 09/28/2014 (Approximate) Physical Exam  Constitutional: She is oriented to person, place, and time. She appears well-developed and well-nourished. No distress.  Uncomfortable appearing  HENT:  Head: Normocephalic and atraumatic.  Eyes: EOM are normal.  Neck: Normal range of motion.  Cardiovascular: Normal rate, regular rhythm and normal heart sounds.   Pulmonary/Chest: Effort normal and breath sounds normal.  Abdominal: Soft. She exhibits no distension. There is no tenderness.  Musculoskeletal: Normal range of motion.  Neurological: She is alert and oriented to person, place, and time.  Skin: Skin is warm and dry.  Psychiatric: She has a normal mood and affect. Judgment normal.  Nursing note and vitals reviewed.   ED Course  Procedures (including critical care time) Labs Review Labs Reviewed  COMPREHENSIVE METABOLIC PANEL - Abnormal; Notable for the following:    CO2 18 (*)    Glucose, Bld 153 (*)    ALT 12 (*)    All other components within normal limits  CBC - Abnormal; Notable for the following:    WBC 15.4 (*)  All other components within normal limits  URINALYSIS, ROUTINE W REFLEX MICROSCOPIC (NOT AT [facility]) - Abnormal; Notable for the following:    APPearance TURBID (*)    Specific Gravity, Urine 1.039 (*)    Hgb urine dipstick LARGE (*)    Protein, ur 30 (*)    Leukocytes, UA SMALL (*)    All other components within normal limits  URINE MICROSCOPIC-ADD ON - Abnormal; Notable for the following:    Squamous Epithelial / LPF FEW (*)    Bacteria, UA FEW (*)    All other components within normal limits  PREGNANCY, URINE  POC URINE PREG, ED    Imaging Review Ct Renal Stone Study  09/29/2014   CLINICAL DATA:  Left flank pain, left lower quadrant pain since last night.  EXAM: CT ABDOMEN AND PELVIS WITHOUT CONTRAST  TECHNIQUE:  Multidetector CT imaging of the abdomen and pelvis was performed following the standard protocol without IV contrast.  COMPARISON:  04/28/2007  FINDINGS: Lung bases are clear.  No effusions.  Heart is normal size.  Prior cholecystectomy. Liver, spleen, pancreas, adrenals and right kidney have an unremarkable unenhanced appearance. 7 mm proximal left ureteral stone causes mild left hydronephrosis and perinephric stranding. No additional renal or ureteral stones. Urinary bladder is unremarkable. Uterus, adnexae unremarkable. Prior bilateral tubal ligation.  Appendix is visualized and is normal. Stomach, large and small bowel unremarkable. Aorta is normal caliber. No free fluid, free air or adenopathy. No acute bony abnormality or focal bone lesion.  IMPRESSION: 7 mm proximal left ureteral stone with mild left hydronephrosis and perinephric stranding.   Electronically Signed   By: Charlett Nose M.D.   On: 09/29/2014 07:09   I have personally reviewed and evaluated these images and lab results as part of my medical decision-making.   EKG Interpretation None      MDM   Final diagnoses:  Flank pain    7 mm proximal left ureteral stone.  Will focus on pain control this time.  Hopefully we can control her pain here in the emergency department she can follow-up with urology with standard outpatient renal colic medications.  Patient has been given standard stone precautions.     Azalia Bilis, MD 09/29/14 [phone]

## 2014-09-29 NOTE — ED Notes (Addendum)
Peripheral IV in right hand removed. WNL.

## 2014-09-29 NOTE — Discharge Instructions (Signed)
Ureteral Colic (Kidney Stones) °Ureteral colic is the result of a condition when kidney stones form inside the kidney. Once kidney stones are formed they may move into the tube that connects the kidney with the bladder (ureter). If this occurs, this condition may cause pain (colic) in the ureter.  °CAUSES  °Pain is caused by stone movement in the ureter and the obstruction caused by the stone. °SYMPTOMS  °The pain comes and goes as the ureter contracts around the stone. The pain is usually intense, sharp, and stabbing in character. The location of the pain may move as the stone moves through the ureter. When the stone is near the kidney the pain is usually located in the back and radiates to the belly (abdomen). When the stone is ready to pass into the bladder the pain is often located in the lower abdomen on the side the stone is located. At this location, the symptoms may mimic those of a urinary tract infection with urinary frequency. Once the stone is located here it often passes into the bladder and the pain disappears completely. °TREATMENT  °· Your caregiver will provide you with medicine for pain relief. °· You may require specialized follow-up X-rays. °· The absence of pain does not always mean that the stone has passed. It may have just stopped moving. If the urine remains completely obstructed, it can cause loss of kidney function or even complete destruction of the involved kidney. It is your responsibility and in your interest that X-rays and follow-ups as suggested by your caregiver are completed. Relief of pain without passage of the stone can be associated with severe damage to the kidney, including loss of kidney function on that side. °· If your stone does not pass on its own, additional measures may be taken by your caregiver to ensure its removal. °HOME CARE INSTRUCTIONS  °· Increase your fluid intake. Water is the preferred fluid since juices containing vitamin C may acidify the urine making it  less likely for certain stones (uric acid stones) to pass. °· Strain all urine. A strainer will be provided. Keep all particulate matter or stones for your caregiver to inspect. °· Take your pain medicine as directed. °· Make a follow-up appointment with your caregiver as directed. °· Remember that the goal is passage of your stone. The absence of pain does not mean the stone is gone. Follow your caregiver's instructions. °· Only take over-the-counter or prescription medicines for pain, discomfort, or fever as directed by your caregiver. °SEEK MEDICAL CARE IF:  °· Pain cannot be controlled with the prescribed medicine. °· You have a fever. °· Pain continues for longer than your caregiver advises it should. °· There is a change in the pain, and you develop chest discomfort or constant abdominal pain. °· You feel faint or pass out. °MAKE SURE YOU:  °· Understand these instructions. °· Will watch your condition. °· Will get help right away if you are not doing well or get worse. °Document Released: 09/30/2004 Document Revised: 04/17/2012 Document Reviewed: 06/17/2010 °Exit[employer]® Patient Information ©2015 [employer]. This information is not intended to replace advice given to you by your health care provider. Make sure you discuss any questions you have with your health care provider. ° °

## 2014-09-29 NOTE — ED Notes (Signed)
Pt reports left abdominal pain and flank pain ongoing since 2100 yesterday. States aleve/ibuprofen not effective.

## 2014-09-30 ENCOUNTER — Telehealth: Payer: Self-pay | Admitting: Family Medicine

## 2014-09-30 ENCOUNTER — Other Ambulatory Visit: Payer: Self-pay | Admitting: Family Medicine

## 2014-09-30 DIAGNOSIS — N200 Calculus of kidney: Secondary | ICD-10-CM

## 2014-09-30 NOTE — Telephone Encounter (Signed)
Pt called and would like a referral to see an Urologist. She was seen in the ER over the weekend and was told she needed a referral for her kidney stone. jw

## 2014-09-30 NOTE — Telephone Encounter (Signed)
Referral placed.

## 2014-10-09 ENCOUNTER — Other Ambulatory Visit: Payer: Self-pay | Admitting: Urology

## 2014-10-10 ENCOUNTER — Encounter (HOSPITAL_COMMUNITY): Payer: Self-pay | Admitting: *Deleted

## 2014-10-11 NOTE — H&P (Signed)
History of Present Illness Consultation for left ureteral stone referred by Dr. Patria Mane.     1-left ureteral stone-patient developed left flank pain a little over a week ago. Her UA showed 11-20 red blood cells per high-powered field, few bacteria. BUN 13, creatinine 0.94, white count 15. She underwent CT scan of the abdomen and pelvis which revealed a 7 mm left proximal ureteral stone (visible on scout, SSD 12 cm, 950 HU).    Since that time she has not seen the stone pass. Her pain has been minimal. She's been staying well-hydrated. No fevers or chills. She has no history of kidney stones.         Past Medical History Problems  1. No pertinent past medical history  Surgical History Problems  1. History of No Surgical Problems  Current Meds 1. No Reported Medications Recorded  Allergies Medication  1. No Known Drug Allergies  Family History Problems  1. No pertinent family history : Mother, Father  Social History Problems    Denied: History of Alcohol use   Caffeine use (F15.90)   Former smoker [phone])   Married   Number of children  Review of Systems Genitourinary, constitutional, skin, eye, otolaryngeal, hematologic/lymphatic, cardiovascular, pulmonary, endocrine, musculoskeletal, gastrointestinal, neurological and psychiatric system(s) were reviewed and pertinent findings if present are noted and are otherwise negative.    Vitals Vital Signs [Data Includes: Last 1 Day]  Recorded: 03Oct2016 09:52AM  Height: 5 ft 10 in Weight: 197 lb  BMI Calculated: 28.27 BSA Calculated: 2.07 Blood Pressure: 133 / 83 Temperature: 98.7 F Heart Rate: 78  Physical Exam Constitutional: Well nourished and well developed . No acute distress.  Pulmonary: No respiratory distress and normal respiratory rhythm and effort.  Cardiovascular: Heart rate and rhythm are normal . No peripheral edema.  Neuro/Psych:. Mood and affect are appropriate.    Results/Data Urine [Data  Includes: Last 1 Day]   03Oct2016  COLOR YELLOW   APPEARANCE CLEAR   SPECIFIC GRAVITY 1.025   pH 6.0   GLUCOSE NEGATIVE   BILIRUBIN NEGATIVE   KETONE NEGATIVE   BLOOD 2+   PROTEIN NEGATIVE   NITRITE NEGATIVE   LEUKOCYTE ESTERASE NEGATIVE   SQUAMOUS EPITHELIAL/HPF 0-5 HPF  WBC 10-20 WBC/HPF  RBC 0-2 RBC/HPF  BACTERIA MODERATE HPF  CRYSTALS NONE SEEN HPF  CASTS NONE SEEN LPF  Other SN   Yeast NONE SEEN HPF   Procedure KUB-comparison to prior CT, findings: 7 mm stone visible left proximal ureter. I didn't appreciate any other stones. The pounds of normal. The bowel gas pattern appeared normal.   Assessment Assessed  1. Pyuria (N39.0) 2. Calculus of left ureter (N20.1)  Plan Calculus of left ureter  1. Follow-up Schedule Surgery Office  Follow-up  Status: Hold For - Appointment   Requested for: 03Oct2016 2. KUB; Status:Resulted - Requires Verification;   Done: 03Oct2016 10:36AM Health Maintenance  3. UA With REFLEX; [Do Not Release]; Status:Complete;   Done: 03Oct2016 09:14AM Pyuria  4. URINE CULTURE; Status:In Progress - Specimen/Data Collected;   Done: 03Oct2016  Discussion/Summary Left proximal ureteral stone-minimal progression compared to prior CT. I discussed with the patient and drew her a diagram as we went over the nature of risks benefits and alternatives to continued stone passage, shockwave lithotripsy, ureteroscopy. All questions answered. She elects to proceed with shockwave lithotripsy. This will be arranged. We discussed worrisome symptoms and when to seek medical attention.  After a thorough review of the management options for the patient's condition the patient  elected to proceed with surgical therapy as noted above. We have discussed the potential benefits and risks of the procedure, side effects of the proposed treatment, the likelihood of the patient achieving the goals of the procedure, and any potential problems that might occur during the procedure or  recuperation. Informed consent has been obtained.

## 2014-10-14 ENCOUNTER — Encounter (HOSPITAL_COMMUNITY)
Admission: RE | Disposition: A | Discharge status: Home / Self Care | Payer: Self-pay | Source: Ambulatory Visit | Attending: Urology

## 2014-10-14 ENCOUNTER — Ambulatory Visit (HOSPITAL_COMMUNITY): Payer: Medicaid Other

## 2014-10-14 ENCOUNTER — Ambulatory Visit (HOSPITAL_COMMUNITY)
Admission: RE | Admit: 2014-10-14 | Discharge: 2014-10-14 | Disposition: A | Discharge status: Home / Self Care | Payer: Medicaid Other | Source: Ambulatory Visit | Attending: Urology | Admitting: Urology

## 2014-10-14 ENCOUNTER — Encounter (HOSPITAL_COMMUNITY): Payer: Self-pay

## 2014-10-14 DIAGNOSIS — N390 Urinary tract infection, site not specified: Secondary | ICD-10-CM | POA: Diagnosis not present

## 2014-10-14 DIAGNOSIS — N201 Calculus of ureter: Secondary | ICD-10-CM | POA: Diagnosis not present

## 2014-10-14 DIAGNOSIS — Z87891 Personal history of nicotine dependence: Secondary | ICD-10-CM | POA: Insufficient documentation

## 2014-10-14 HISTORY — DX: Chronic kidney disease, unspecified: N18.9

## 2014-10-14 SURGERY — LITHOTRIPSY, ESWL
Anesthesia: LOCAL | Laterality: Left

## 2014-10-14 MED ORDER — DIPHENHYDRAMINE HCL 25 MG PO CAPS
25.0000 mg | ORAL_CAPSULE | ORAL | Status: AC
  Administered 2014-10-14: 25 mg via ORAL
  Filled 2014-10-14: qty 1

## 2014-10-14 MED ORDER — DIAZEPAM 5 MG PO TABS
10.0000 mg | ORAL_TABLET | ORAL | Status: AC
  Administered 2014-10-14: 10 mg via ORAL
  Filled 2014-10-14: qty 2

## 2014-10-14 MED ORDER — CIPROFLOXACIN HCL 500 MG PO TABS
500.0000 mg | ORAL_TABLET | ORAL | Status: AC
  Administered 2014-10-14: 500 mg via ORAL
  Filled 2014-10-14: qty 1

## 2014-10-14 MED ORDER — SODIUM CHLORIDE 0.9 % IV SOLN
INTRAVENOUS | Status: DC
  Administered 2014-10-14: 09:00:00 via INTRAVENOUS

## 2014-10-14 NOTE — Interval H&P Note (Signed)
History and Physical Interval Note:  10/14/2014 7:24 AM  [name]  has presented today for surgery, with the diagnosis of LEFT URETERAL STONE  The various methods of treatment have been discussed with the patient and family. After consideration of risks, benefits and other options for treatment, the patient has consented to  Procedure(s): LEFT EXTRACORPOREAL SHOCK WAVE LITHOTRIPSY (ESWL) (Left) as a surgical intervention .  The patient's history has been reviewed, patient examined, no change in status, stable for surgery.  I have reviewed the patient's chart and labs.  Questions were answered to the patient's satisfaction.     Jorian Willhoite A

## 2014-10-14 NOTE — Discharge Instructions (Signed)
I have reviewed discharge instructions in detail with the patient. They will follow-up with me or their physician as scheduled. My nurse will also be calling the patients as per protocol.   

## 2014-11-11 ENCOUNTER — Ambulatory Visit (INDEPENDENT_AMBULATORY_CARE_PROVIDER_SITE_OTHER): Payer: Medicaid Other | Admitting: *Deleted

## 2014-11-11 DIAGNOSIS — Z111 Encounter for screening for respiratory tuberculosis: Secondary | ICD-10-CM | POA: Diagnosis not present

## 2014-11-11 NOTE — Progress Notes (Signed)
   PPD placed Left Forearm.  Pt to return 11/13/14 for reading.  Pt tolerated intradermal injection. Clovis PuMartin, Tamika L, RN

## 2014-11-13 ENCOUNTER — Ambulatory Visit: Payer: Medicaid Other | Admitting: *Deleted

## 2014-11-13 ENCOUNTER — Encounter: Payer: Self-pay | Admitting: *Deleted

## 2014-11-13 DIAGNOSIS — Z111 Encounter for screening for respiratory tuberculosis: Secondary | ICD-10-CM

## 2014-11-13 LAB — TB SKIN TEST
Induration: 0 mm
TB SKIN TEST: NEGATIVE

## 2014-11-13 NOTE — Progress Notes (Signed)
   PPD Reading Note PPD read and results entered in EpicCare. Result: 0 mm induration. Interpretation: Negative If test not read within 48-72 hours of initial placement, patient advised to repeat in other arm 1-3 weeks after this test. Allergic reaction: no  Glennie Bose L, RN  

## 2015-12-01 ENCOUNTER — Ambulatory Visit (INDEPENDENT_AMBULATORY_CARE_PROVIDER_SITE_OTHER): Payer: Medicaid Other | Admitting: *Deleted

## 2015-12-01 DIAGNOSIS — Z111 Encounter for screening for respiratory tuberculosis: Secondary | ICD-10-CM | POA: Diagnosis not present

## 2015-12-01 NOTE — Progress Notes (Signed)
   PPD Placement note Debra JasperGlenda Dominguez, 35 y.o. female is here today for placement of PPD test Reason for PPD test: work Pt taken PPD test before: yes Verified in allergy area and with patient that they are not allergic to the products PPD is made of (Phenol or Tween). No: NO Is patient taking any oral or IV steroid medication now or have they taken it in the last month? no Has the patient ever received the BCG vaccine?: no Has the patient been in recent contact with anyone known or suspected of having active TB disease?: no      Date of exposure (if applicable): na      Name of person they were exposed to (if applicable): N/A na Patient's Country of origin?: n/a O: Alert and oriented in NAD. P:  PPD placed on 12/01/2015.  Patient advised to return for reading within 48-72 hours.   Debra Dominguez, CMA

## 2015-12-03 ENCOUNTER — Ambulatory Visit: Payer: Medicaid Other | Admitting: *Deleted

## 2015-12-03 DIAGNOSIS — Z111 Encounter for screening for respiratory tuberculosis: Secondary | ICD-10-CM

## 2015-12-03 LAB — TB SKIN TEST
INDURATION: 0 mm
TB Skin Test: NEGATIVE

## 2015-12-03 NOTE — Progress Notes (Signed)
PPD Reading Note  PPD read and results entered in EpicCare.  Result: 0 mm induration.  Interpretation: negative  Allergic reaction: no

## 2016-02-27 IMAGING — CT CT RENAL STONE PROTOCOL
2 of 4 series · 16 of 46 positions shown, 18 images · non-contrast
Comparison: 04/28/2007

CLINICAL DATA: Left flank pain, left lower quadrant pain since last
night.

EXAM:
CT ABDOMEN AND PELVIS WITHOUT CONTRAST
TECHNIQUE: Multidetector CT imaging of the abdomen and pelvis was performed
following the standard protocol without IV contrast.

[Series 2: stone study 5.0 i30f 1 · axial · 0.70mm/px · z∈[+860,+1365]mm · 13 of 111 slices shown, 15 images]
[im 5/111  soft-tissue]
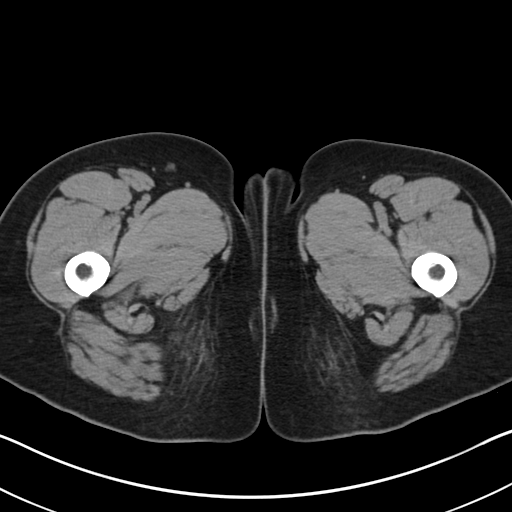
[im 5/111  bone]
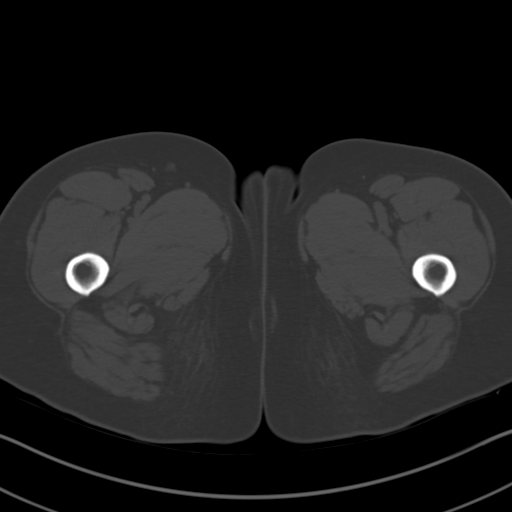
[im 15/111  soft-tissue]
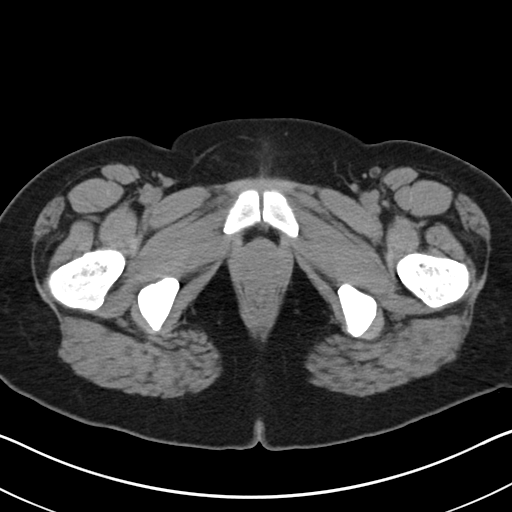
[im 24/111  soft-tissue]
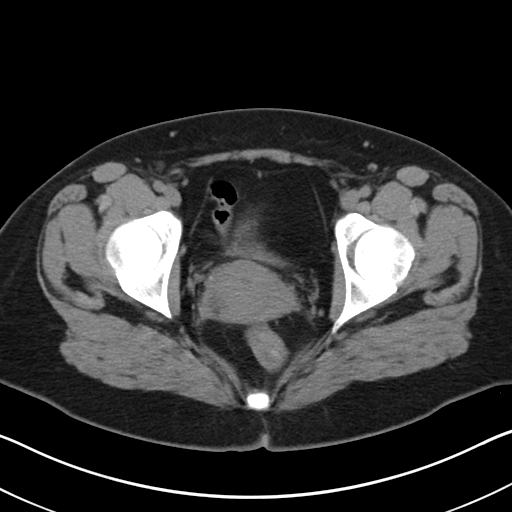
[im 29/111  soft-tissue]
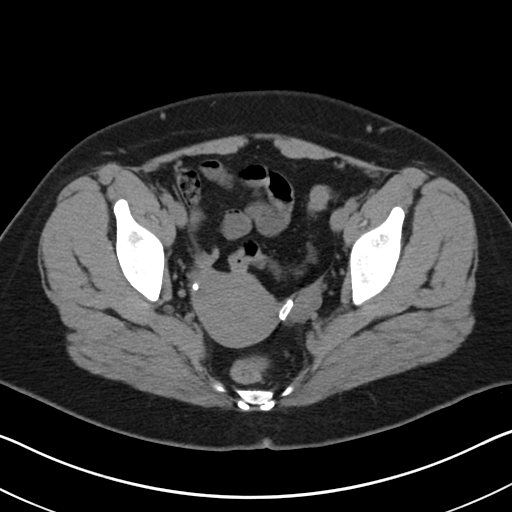
[im 39/111  soft-tissue]
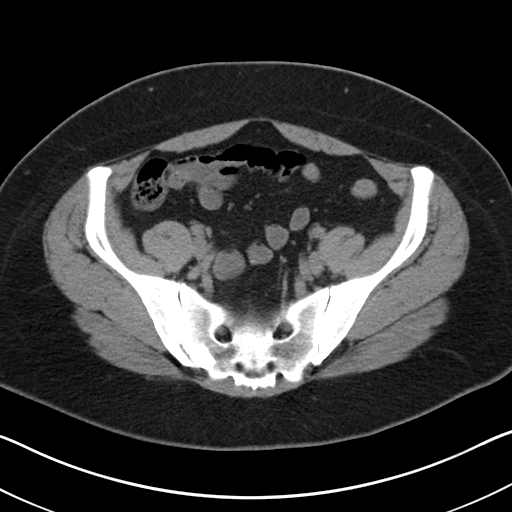
[im 48/111  soft-tissue]
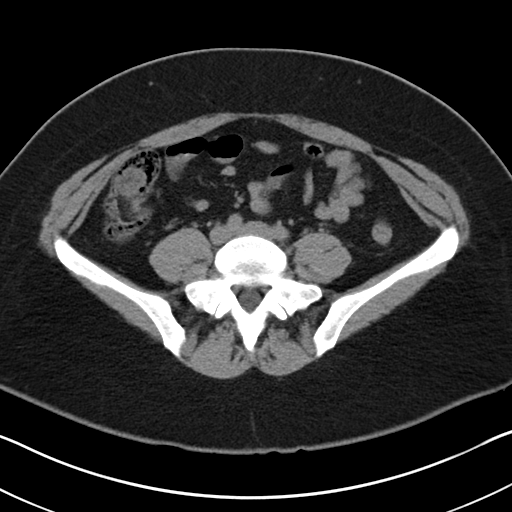
[im 58/111  soft-tissue]
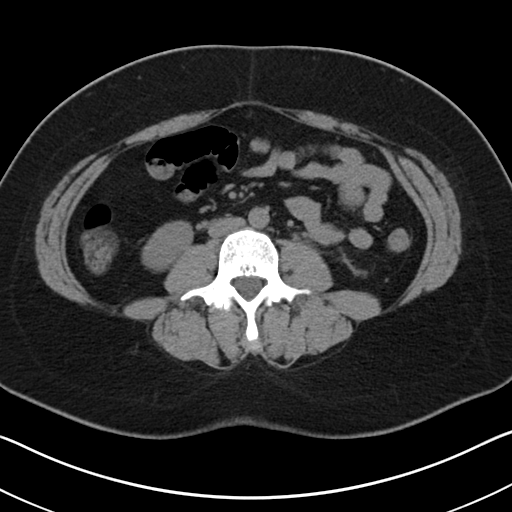
[im 63/111  soft-tissue]
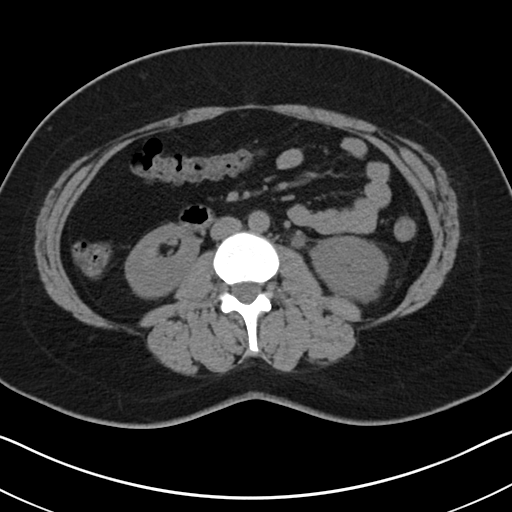
[im 72/111  soft-tissue]
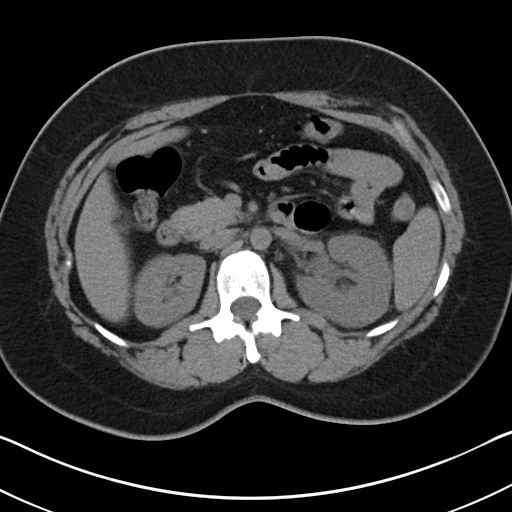
[im 72/111  bone]
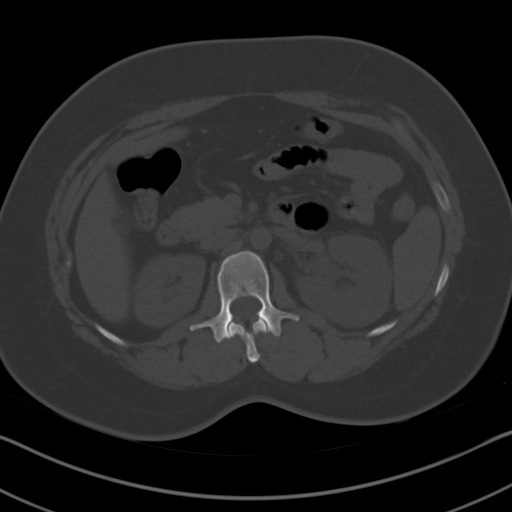
[im 82/111  soft-tissue]
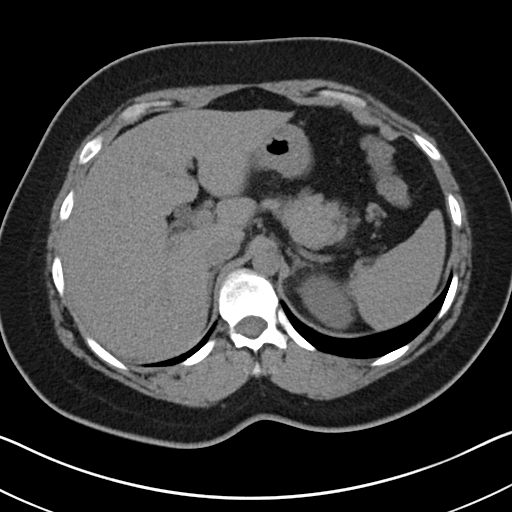
[im 87/111  soft-tissue]
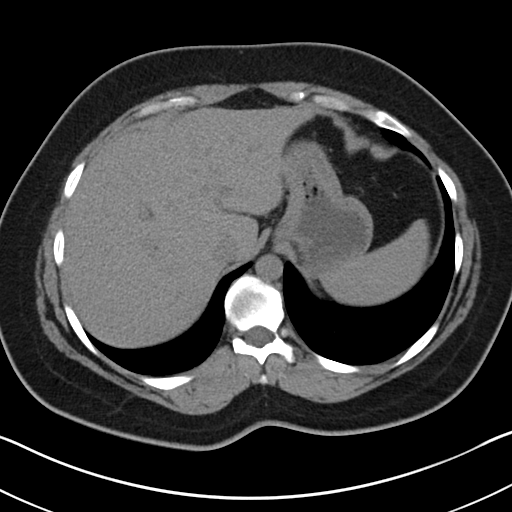
[im 96/111  soft-tissue]
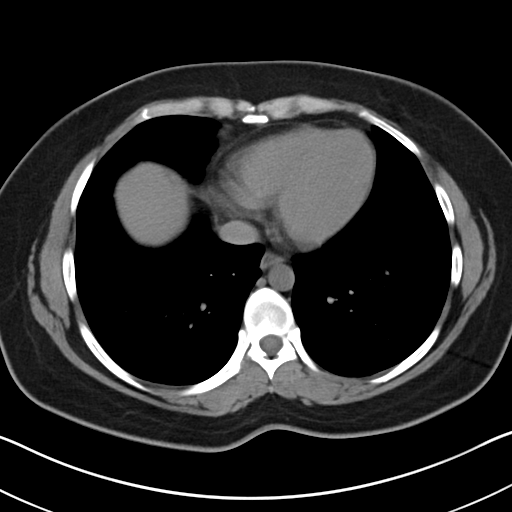
[im 106/111  soft-tissue]
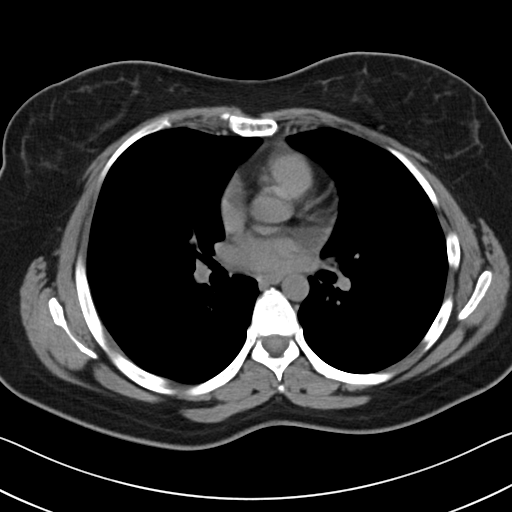

[Series 5: coronal soft tissue · coronal · 0.69mm/px · 3 of 90 slices shown]
[im 30/90  soft-tissue]
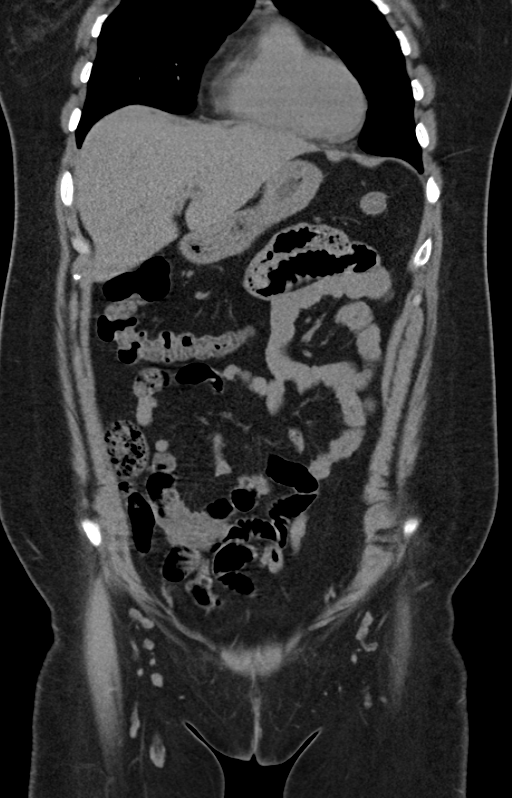
[im 40/90  soft-tissue]
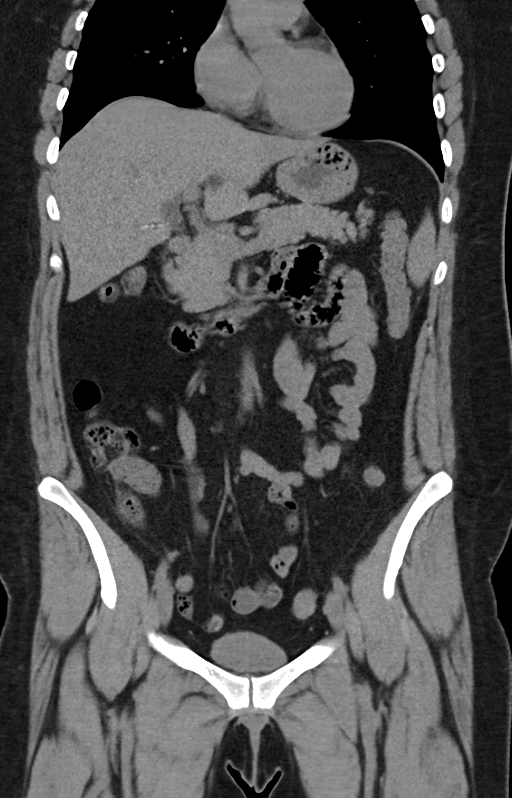
[im 50/90  soft-tissue]
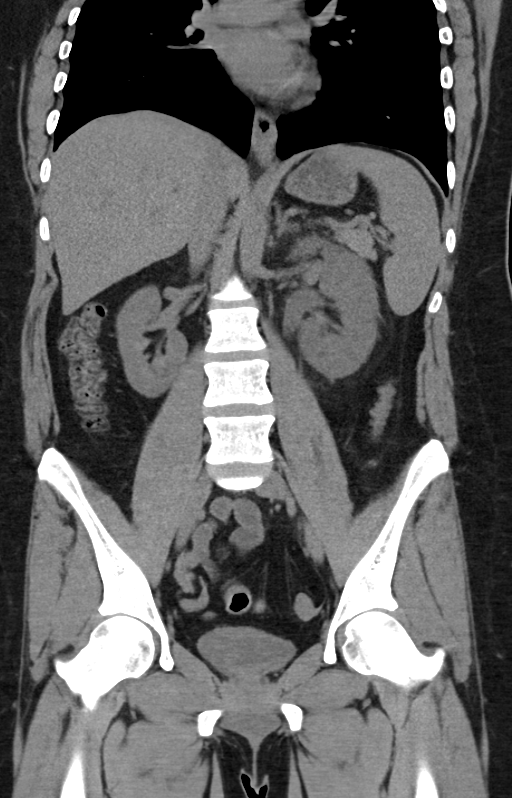

[16 of 46 positions shown; findings below may reference images not displayed]

FINDINGS: Lung bases are clear.  No effusions.  Heart is normal size.

Prior cholecystectomy. Liver, spleen, pancreas, adrenals and right
kidney have an unremarkable unenhanced appearance. 7 mm proximal
left ureteral stone causes mild left hydronephrosis and perinephric
stranding. No additional renal or ureteral stones. Urinary bladder
is unremarkable. Uterus, adnexae unremarkable. Prior bilateral tubal
ligation.

Appendix is visualized and is normal. Stomach, large and small bowel
unremarkable. Aorta is normal caliber. No free fluid, free air or
adenopathy. No acute bony abnormality or focal bone lesion.
IMPRESSION: 7 mm proximal left ureteral stone with mild left hydronephrosis and
perinephric stranding.

## 2016-04-08 DIAGNOSIS — N898 Other specified noninflammatory disorders of vagina: Secondary | ICD-10-CM | POA: Diagnosis present

## 2016-04-08 DIAGNOSIS — N189 Chronic kidney disease, unspecified: Secondary | ICD-10-CM | POA: Insufficient documentation

## 2016-04-08 DIAGNOSIS — B373 Candidiasis of vulva and vagina: Secondary | ICD-10-CM | POA: Insufficient documentation

## 2016-04-08 DIAGNOSIS — Z87891 Personal history of nicotine dependence: Secondary | ICD-10-CM | POA: Insufficient documentation

## 2016-04-09 ENCOUNTER — Emergency Department (HOSPITAL_COMMUNITY)
Admission: EM | Admit: 2016-04-09 | Discharge: 2016-04-09 | Disposition: A | Discharge status: Home / Self Care | Payer: Medicaid Other | Attending: Emergency Medicine | Admitting: Emergency Medicine

## 2016-04-09 ENCOUNTER — Encounter (HOSPITAL_COMMUNITY): Payer: Self-pay | Admitting: Emergency Medicine

## 2016-04-09 DIAGNOSIS — B3731 Acute candidiasis of vulva and vagina: Secondary | ICD-10-CM

## 2016-04-09 DIAGNOSIS — B373 Candidiasis of vulva and vagina: Secondary | ICD-10-CM

## 2016-04-09 LAB — WET PREP, GENITAL
CLUE CELLS WET PREP: NONE SEEN
Sperm: NONE SEEN
TRICH WET PREP: NONE SEEN

## 2016-04-09 LAB — GC/CHLAMYDIA PROBE AMP (~~LOC~~) NOT AT ARMC
Chlamydia: NEGATIVE
Neisseria Gonorrhea: NEGATIVE

## 2016-04-09 MED ORDER — FLUCONAZOLE 150 MG PO TABS: 150.0000 mg | ORAL_TABLET | Freq: Once | ORAL | 0 refills | Status: AC

## 2016-04-09 MED ORDER — FLUCONAZOLE 100 MG PO TABS
150.0000 mg | ORAL_TABLET | Freq: Once | ORAL | Status: AC
  Administered 2016-04-09: 150 mg via ORAL
  Filled 2016-04-09: qty 2

## 2016-04-09 NOTE — ED Provider Notes (Signed)
[facility]EM[facility]Y DEPT Provider Note   CSN: [id number] Arrival date & time: 04/08/16  2349     History   Chief Complaint Chief Complaint  Patient presents with  . Vaginal Itching    HPI Debra Dominguez is a 36 y.o. female.  Patient presents with c/o vaginal irritation and itching for the past 2 days. No pelvic pain, discharge, dysuria or irregular vaginal bleeding. No fever, abdominal pain or nausea. She reports using Vagisil without relief. No recent antibiotic use.   The history is provided by the patient. No language interpreter was used.  Vaginal Itching  Pertinent negatives include no abdominal pain.    Past Medical History:  Diagnosis Date  . Abnormal Pap smear of cervix   . Chronic kidney disease    kidney stones  . Gallstones   . No pertinent past medical history     Patient Active Problem List   Diagnosis Date Noted  . Viral URI 02/28/2013  . Tubal ligation evaluation 07/16/2010  . Screening exam for skin cancer 07/16/2010  . Pap smear for cervical cancer screening 07/16/2010  . Overweight(278.02) 10/10/2007  . TOBACCO ABUSE 06/30/2006    Past Surgical History:  Procedure Laterality Date  . CHOLECYSTECTOMY    . DILATION AND CURETTAGE OF UTERUS    . dilation and curretage    . GALLBLADDER SURGERY    . LAPAROSCOPIC TUBAL LIGATION  10/08/2010   Procedure: LAPAROSCOPIC TUBAL LIGATION;  Surgeon: Hollie Salk C. Marice Potter, MD;  Location: [facility];  Service: Gynecology;  Laterality: Bilateral;  . LEEP  36 years old    OB History    Gravida Para Term Preterm AB Living   SAB TAB Ectopic Multiple Live Births   1               Home Medications    Prior to Admission medications   Medication Sig Start Date End Date Taking? Authorizing Provider  ibuprofen (ADVIL,MOTRIN) 600 MG tablet Take 1 tablet (600 mg total) by mouth every 8 (eight) hours as needed. Patient taking differently: Take 600 mg by mouth every 8 (eight) hours as needed for moderate pain.   09/29/14   Azalia Bilis, MD  ondansetron (ZOFRAN ODT) 8 MG disintegrating tablet Take 1 tablet (8 mg total) by mouth every 8 (eight) hours as needed for nausea or vomiting. 09/29/14   Azalia Bilis, MD  oxyCODONE-acetaminophen (PERCOCET/ROXICET) 5-325 MG per tablet Take 1 tablet by mouth every 4 (four) hours as needed for severe pain. 09/29/14   Azalia Bilis, MD    Family History No family history on file.  Social History Social History  Substance Use Topics  . Smoking status: Former Smoker    Packs/day: 0.30    Types: Cigarettes    Quit date: 12/02/2011  . Smokeless tobacco: Never Used  . Alcohol use 0.0 oz/week     Comment: 1 drink per month     Allergies   Patient has no known allergies.   Review of Systems Review of Systems  Constitutional: Negative for fever.  Gastrointestinal: Negative for abdominal pain and nausea.  Genitourinary: Positive for vaginal pain (Itching and irritation). Negative for dysuria, pelvic pain and vaginal discharge.  Musculoskeletal: Negative for back pain.  Skin: Negative for wound.     Physical Exam Updated Vital Signs BP (!) 147/76 (BP Location: Right Arm)   Pulse (!) 108   Temp 98.6 F (37 C) (Oral)   Resp 16   Ht   (1.753 m)   Wt 89.8 kg   LMP 03/27/2016 (Approximate)   SpO2 100%   BMI 29.24 kg/m   Physical Exam  Constitutional: She is oriented to person, place, and time. She appears well-developed and well-nourished.  Neck: Normal range of motion.  Pulmonary/Chest: Effort normal.  Abdominal: Soft. There is no tenderness. There is no guarding.  Genitourinary:  Genitourinary Comments: Uniform white vaginal discharge present in vaginal vault. Cervix unremarkable in appearance and nontender. No adnexal mass or tenderness. External vagina without excoriation, rash or vulvar swelling.  Neurological: She is alert and oriented to person, place, and time.  Skin: Skin is warm and dry.     ED Treatments / Results  Labs (all labs  ordered are listed, but only abnormal results are displayed) Labs Reviewed  WET PREP, GENITAL - Abnormal; Notable for the following:       Result Value   Yeast Wet Prep HPF POC PRESENT (*)    WBC, Wet Prep HPF POC MANY (*)    All other components within normal limits  GC/CHLAMYDIA PROBE AMP ([place]) NOT AT [facility]    EKG  EKG Interpretation None       Radiology No results found.  Procedures Procedures (including critical care time)  Medications Ordered in ED Medications  fluconazole (DIFLUCAN) tablet 150 mg (not administered)     Initial Impression / Assessment and Plan / ED Course  I have reviewed the triage vital signs and the nursing notes.  Pertinent labs & imaging results that were available during my care of the patient were reviewed by me and considered in my medical decision making (see chart for details).     Patient presents with uncomplicated vaginal itching and is found to have yeast on wet prep. Doubt STD. Using Vagisil without relief. Patient educated about appropriate yeast preparations vs Vagisil. Will treat with Diflucan  Final Clinical Impressions(s) / ED Diagnoses   Final diagnoses:  None   1. Vaginal yeast infection  New Prescriptions New Prescriptions   No medications on file     Elpidio Anis, PA-C 04/09/16 1610    Gilda Crease, MD 04/09/16 [phone]

## 2016-04-09 NOTE — ED Triage Notes (Signed)
Pt c/o vaginal itching and burning x's 2 days.  St's has used OTC meds without relief.  Denies vag. discharge

## 2016-04-09 NOTE — ED Notes (Signed)
Pt departed in NAD, refused use of wheelchair.  

## 2016-06-23 ENCOUNTER — Emergency Department (HOSPITAL_COMMUNITY)
Admission: EM | Admit: 2016-06-23 | Discharge: 2016-06-23 | Disposition: A | Discharge status: Home / Self Care | Payer: Medicaid Other | Attending: Emergency Medicine | Admitting: Emergency Medicine

## 2016-06-23 ENCOUNTER — Emergency Department (HOSPITAL_COMMUNITY): Payer: Medicaid Other

## 2016-06-23 ENCOUNTER — Encounter (HOSPITAL_COMMUNITY): Payer: Self-pay

## 2016-06-23 DIAGNOSIS — R112 Nausea with vomiting, unspecified: Secondary | ICD-10-CM | POA: Diagnosis present

## 2016-06-23 DIAGNOSIS — Z87891 Personal history of nicotine dependence: Secondary | ICD-10-CM | POA: Insufficient documentation

## 2016-06-23 DIAGNOSIS — R197 Diarrhea, unspecified: Secondary | ICD-10-CM | POA: Diagnosis not present

## 2016-06-23 DIAGNOSIS — N189 Chronic kidney disease, unspecified: Secondary | ICD-10-CM | POA: Diagnosis not present

## 2016-06-23 DIAGNOSIS — R1084 Generalized abdominal pain: Secondary | ICD-10-CM | POA: Insufficient documentation

## 2016-06-23 LAB — COMPREHENSIVE METABOLIC PANEL
ALK PHOS: 74 U/L (ref 38–126)
ALT: 35 U/L (ref 14–54)
AST: 38 U/L (ref 15–41)
Albumin: 4.1 g/dL (ref 3.5–5.0)
Anion gap: 11 (ref 5–15)
BUN: 8 mg/dL (ref 6–20)
CALCIUM: 9.3 mg/dL (ref 8.9–10.3)
CHLORIDE: 104 mmol/L (ref 101–111)
CO2: 20 mmol/L — AB (ref 22–32)
CREATININE: 0.78 mg/dL (ref 0.44–1.00)
GFR calc non Af Amer: >60 mL/min (ref >60)
GLUCOSE: 102 mg/dL — AB (ref 65–99)
Potassium: 3 mmol/L — ABNORMAL LOW (ref 3.5–5.1)
SODIUM: 135 mmol/L (ref 135–145)
Total Bilirubin: 0.7 mg/dL (ref 0.3–1.2)
Total Protein: 6.7 g/dL (ref 6.5–8.1)

## 2016-06-23 LAB — CBC
HCT: 42.7 % (ref 36.0–46.0)
Hemoglobin: 15.5 g/dL — ABNORMAL HIGH (ref 12.0–15.0)
MCH: 28.9 pg (ref 26.0–34.0)
MCHC: 36.3 g/dL — AB (ref 30.0–36.0)
MCV: 79.7 fL (ref 78.0–100.0)
PLATELETS: 255 10*3/uL (ref 150–400)
RBC: 5.36 MIL/uL — ABNORMAL HIGH (ref 3.87–5.11)
RDW: 12.7 % (ref 11.5–15.5)
WBC: 6 10*3/uL (ref 4.0–10.5)

## 2016-06-23 LAB — URINALYSIS, ROUTINE W REFLEX MICROSCOPIC
BILIRUBIN URINE: NEGATIVE
Glucose, UA: NEGATIVE mg/dL
Ketones, ur: 20 mg/dL — AB
Nitrite: NEGATIVE
PH: 5 (ref 5.0–8.0)
Protein, ur: 30 mg/dL — AB
SPECIFIC GRAVITY, URINE: 1.027 (ref 1.005–1.030)

## 2016-06-23 LAB — I-STAT BETA HCG BLOOD, ED (MC, WL, AP ONLY)

## 2016-06-23 LAB — LIPASE, BLOOD: LIPASE: 27 U/L (ref 11–51)

## 2016-06-23 MED ORDER — SODIUM CHLORIDE 0.9 % IV BOLUS (SEPSIS)
1000.0000 mL | Freq: Once | INTRAVENOUS | Status: AC
  Administered 2016-06-23: 1000 mL via INTRAVENOUS

## 2016-06-23 MED ORDER — KETOROLAC TROMETHAMINE 30 MG/ML IJ SOLN
15.0000 mg | Freq: Once | INTRAMUSCULAR | Status: AC
  Administered 2016-06-23: 15 mg via INTRAVENOUS
  Filled 2016-06-23: qty 1

## 2016-06-23 MED ORDER — POTASSIUM CHLORIDE CRYS ER 20 MEQ PO TBCR: 20.0000 meq | EXTENDED_RELEASE_TABLET | Freq: Two times a day (BID) | ORAL | 0 refills | Status: AC

## 2016-06-23 MED ORDER — ONDANSETRON HCL 4 MG/2ML IJ SOLN
4.0000 mg | Freq: Once | INTRAMUSCULAR | Status: AC
Start: 2016-06-23 — End: 2016-06-23
  Administered 2016-06-23: 4 mg via INTRAVENOUS
  Filled 2016-06-23: qty 2

## 2016-06-23 MED ORDER — POTASSIUM CHLORIDE CRYS ER 20 MEQ PO TBCR
40.0000 meq | EXTENDED_RELEASE_TABLET | Freq: Once | ORAL | Status: AC
  Administered 2016-06-23: 40 meq via ORAL
  Filled 2016-06-23: qty 2

## 2016-06-23 MED ORDER — DICYCLOMINE HCL 20 MG PO TABS: 20.0000 mg | ORAL_TABLET | Freq: Two times a day (BID) | ORAL | 0 refills | Status: AC

## 2016-06-23 MED ORDER — ONDANSETRON 8 MG PO TBDP: 8.0000 mg | ORAL_TABLET | Freq: Three times a day (TID) | ORAL | 0 refills | Status: AC | PRN

## 2016-06-23 NOTE — ED Notes (Signed)
Pt ambulated to room from waiting room, tolerated well. Pt getting undressed now.

## 2016-06-23 NOTE — Discharge Instructions (Signed)
Your labs and imaging is reassuring. This is likely a viral stomach illness. Please use the Zofran as needed for nausea. Use the Bentyl for cramping. May use over-the-counter Imodium for diarrhea. Potassium was slightly low. Make sure you take the potassium pill twice a day for the next 3 days starting tomorrow. Will need follow-up with her primary care doctor to recheck her potassium in the next week. Return to the ED should help any bloody stools, fevers, worsening vomiting, worsening pain. I did culture your urine. If it shows something that needs treatment with antibiotic will be informed. If he develop any urinary symptoms or return to the ED.  Your abdominal pain is likely from gastritis, reflux or a stomach ulcer. You will need to take the prescribed proton pump inhibitor as directed, and avoid spicy/fatty/acidic foods. Avoid laying down flat within 30 minutes of eating. Avoid NSAIDs like ibuprofen or Aleve on an empty stomach. Use zofran as needed for nausea. Follow up with the gastroenterologist (GI doctor) listed for ongoing evaluation of your abdominal pain. Return to the ER for new or worsening symptoms, any additional concers.   SEEK IMMEDIATE MEDICAL ATTENTION IF YOU DEVELOP ANY OF THE FOLLOWING SYMPTOMS: The pain does not go away or becomes severe.  A temperature above 101 develops.  Repeated vomiting occurs (multiple episodes).  Blood is being passed in stools or vomit (bright red or black tarry stools).  Return also if you develop chest pain, difficulty breathing, dizziness or fainting

## 2016-06-23 NOTE — ED Triage Notes (Signed)
Pt reports diarrhea, nausea and generalized abdominal pain that started on Saturday, unrelieved with Peptobismol. She also reports her tongue has black patches on it

## 2016-06-23 NOTE — ED Notes (Signed)
C/o diarrhea onset Sat. , states her youngest son was sick for several days , took pep-to bismol. At least every 2 hours

## 2016-06-24 LAB — URINE CULTURE: CULTURE: NO GROWTH

## 2016-06-24 NOTE — ED Provider Notes (Signed)
[facility] Provider Note   CSN: [id number] Arrival date & time: 06/23/16  0901     History   Chief Complaint Chief Complaint  Patient presents with  . Abdominal Pain    HPI Debra Dominguez is a 36 y.o. female.  HPI 36 year old Caucasian female past medical history significant for gallstones presents to the emergency Department today with complaints of nausea, vomiting, diarrhea, generalized abdominal cramping. Patient states that her symptoms started approximately 4 days ago. States she started with diarrhea that then progressed to nausea. She denies any emesis. Patient states that her younger son was sick with a GI illness a few days prior. Patient states that her diarrhea is watery in nature. Denies any foul-smelling loose stools. Does not report any specific localized abdominal pain but endorses cramping following the nausea. Patient has been taking Pepto-Bismol for her symptoms rule little relief. She states that her tongue and stool has turned black which worried her and prompted her to come to the ED. Patient tried no other medications for her symptoms. She denies any recent antibiotic use. She denies any recent travel outside the [place]. She denies any recent new foods. She denies any fever, chills, blood in her stool, urinary symptoms, vaginal symptoms. Patient is able tolerate small amounts of by mouth fluids and food. Past Medical History:  Diagnosis Date  . Abnormal Pap smear of cervix   . Chronic kidney disease    kidney stones  . Gallstones   . No pertinent past medical history     Patient Active Problem List   Diagnosis Date Noted  . Viral URI 02/28/2013  . Tubal ligation evaluation 07/16/2010  . Screening exam for skin cancer 07/16/2010  . Pap smear for cervical cancer screening 07/16/2010  . Overweight(278.02) 10/10/2007  . TOBACCO ABUSE 06/30/2006    Past Surgical History:  Procedure Laterality Date  . CHOLECYSTECTOMY    . DILATION AND CURETTAGE OF  UTERUS    . dilation and curretage    . GALLBLADDER SURGERY    . LAPAROSCOPIC TUBAL LIGATION  10/08/2010   Procedure: LAPAROSCOPIC TUBAL LIGATION;  Surgeon: Hollie Salk C. Marice Potter, MD;  Location: [facility];  Service: Gynecology;  Laterality: Bilateral;  . LEEP  36 years old    OB History    Gravida Para Term Preterm AB Living   3 2 2   1 2    SAB TAB Ectopic Multiple Live Births   1               Home Medications    Prior to Admission medications   Medication Sig Start Date End Date Taking? Authorizing Provider  dicyclomine (BENTYL) 20 MG tablet Take 1 tablet (20 mg total) by mouth 2 (two) times daily. 06/23/16   Rise Mu, PA-C  ibuprofen (ADVIL,MOTRIN) 600 MG tablet Take 1 tablet (600 mg total) by mouth every 8 (eight) hours as needed. Patient taking differently: Take 600 mg by mouth every 8 (eight) hours as needed for moderate pain.  09/29/14   Azalia Bilis, MD  ondansetron (ZOFRAN-ODT) 8 MG disintegrating tablet Take 1 tablet (8 mg total) by mouth every 8 (eight) hours as needed for nausea. 06/23/16   Rise Mu, PA-C  oxyCODONE-acetaminophen (PERCOCET/ROXICET) 5-325 MG per tablet Take 1 tablet by mouth every 4 (four) hours as needed for severe pain. 09/29/14   Azalia Bilis, MD  potassium chloride SA (K-DUR,KLOR-CON) 20 MEQ tablet Take 1 tablet (20 mEq total) by mouth 2 (two) times daily. 06/23/16  Rise MuLeaphart, Kenneth T, PA-C    Family History No family history on file.  Social History Social History  Substance Use Topics  . Smoking status: Former Smoker    Packs/day: 0.30    Types: Cigarettes    Quit date: 12/02/2011  . Smokeless tobacco: Never Used  . Alcohol use 0.0 oz/week     Comment: 1 drink per month     Allergies   Patient has no known allergies.   Review of Systems Review of Systems  Constitutional: Negative for chills and fever.  HENT: Negative for congestion.   Respiratory: Negative for cough and shortness of breath.   Cardiovascular: Negative for  chest pain.  Gastrointestinal: Positive for abdominal pain, diarrhea and nausea. Negative for blood in stool and vomiting.  Genitourinary: Negative for dysuria, flank pain, frequency, hematuria, pelvic pain, vaginal bleeding and vaginal discharge.  Musculoskeletal: Negative for myalgias.  Skin: Negative for color change.  Neurological: Negative for dizziness, syncope, weakness, light-headedness and headaches.  Psychiatric/Behavioral: Negative for sleep disturbance. The patient is not nervous/anxious.      Physical Exam Updated Vital Signs BP (!) 131/99 (BP Location: Left Arm)   Pulse (!) 109   Temp 98.6 F (37 C) (Oral)   Resp 18   LMP 06/07/2016   SpO2 97%   Physical Exam  Constitutional: She is oriented to person, place, and time. She appears well-developed and well-nourished.  Non-toxic appearance. No distress.  HENT:  Head: Normocephalic and atraumatic.  Nose: Nose normal.  Mouth/Throat: Oropharynx is clear and moist.  Mucous membranes appear moist. Black spot noted on tongue.  Eyes: Conjunctivae and EOM are normal. Pupils are equal, round, and reactive to light. Right eye exhibits no discharge. Left eye exhibits no discharge.  Neck: Normal range of motion. Neck supple.  Cardiovascular: Normal rate, regular rhythm, normal heart sounds and intact distal pulses.   Hr 80  Pulmonary/Chest: Effort normal and breath sounds normal. No respiratory distress. She has no wheezes. She exhibits no tenderness.  Abdominal: Soft. Bowel sounds are normal. There is no tenderness. There is no rigidity, no rebound, no guarding and no CVA tenderness.  Musculoskeletal: Normal range of motion. She exhibits no tenderness.  Lymphadenopathy:    She has no cervical adenopathy.  Neurological: She is alert and oriented to person, place, and time.  Skin: Skin is warm and dry. Capillary refill takes less than 2 seconds.  Psychiatric: Her behavior is normal. Judgment and thought content normal.  Nursing  note and vitals reviewed.    ED Treatments / Results  Labs (all labs ordered are listed, but only abnormal results are displayed) Labs Reviewed  COMPREHENSIVE METABOLIC PANEL - Abnormal; Notable for the following:       Result Value   Potassium 3.0 (*)    CO2 20 (*)    Glucose, Bld 102 (*)    All other components within normal limits  CBC - Abnormal; Notable for the following:    RBC 5.36 (*)    Hemoglobin 15.5 (*)    MCHC 36.3 (*)    All other components within normal limits  URINALYSIS, ROUTINE W REFLEX MICROSCOPIC - Abnormal; Notable for the following:    Color, Urine AMBER (*)    APPearance CLOUDY (*)    Hgb urine dipstick MODERATE (*)    Ketones, ur 20 (*)    Protein, ur 30 (*)    Leukocytes, UA LARGE (*)    Bacteria, UA MANY (*)    Squamous Epithelial / LPF  6-30 (*)    All other components within normal limits  URINE CULTURE  LIPASE, BLOOD  I-STAT BETA HCG BLOOD, ED ([facility], [facility], [facility] ONLY)    EKG  EKG Interpretation None       Radiology Dg Abdomen 1 View  Result Date: 06/23/2016 CLINICAL DATA:  Diarrhea and abdominal pain. EXAM: ABDOMEN - 1 VIEW COMPARISON:  11/05/2014 FINDINGS: The bowel gas pattern is normal. No radio-opaque calculi or other significant radiographic abnormality are seen. Evidence of previous cholecystectomy and tubal ligation. IMPRESSION: Benign-appearing abdomen. Electronically Signed   By: Francene Boyers M.D.   On: 06/23/2016 11:26    Procedures Procedures (including critical care time)  Medications Ordered in ED Medications  sodium chloride 0.9 % bolus 1,000 mL (0 mLs Intravenous Stopped 06/23/16 1257)  ondansetron (ZOFRAN) injection 4 mg (4 mg Intravenous Given 06/23/16 1123)  ketorolac (TORADOL) 30 MG/ML injection 15 mg (15 mg Intravenous Given 06/23/16 1124)  potassium chloride SA (K-DUR,KLOR-CON) CR tablet 40 mEq (40 mEq Oral Given 06/23/16 1213)     Initial Impression / Assessment and Plan / ED Course  I have reviewed the triage vital  signs and the nursing notes.  Pertinent labs & imaging results that were available during my care of the patient were reviewed by me and considered in my medical decision making (see chart for details).     Patient with symptoms consistent with viral gastroenteritis.  Vitals are stable, no fever.  Patient was signs of mild dehydration. UA with many bacteria and large leukocytes with rbc's however patient has no urinary symptoms. Likely consistent with dehydration however we'll culture urine and not treat at this time. Mild hypokalemia of 3.0. This was replaced orally patient was discharged with 3-4 days of oral potassium with follow-up with PCP to recheck potassium. Blood work seems to be hemoconcentrated. Urine shows signs of dehydration. Patient given IV fluids with improvement in her heart rate. tolerating PO fluids > 6 oz.  Lungs are clear.  No focal abdominal pain, no concern for appendicitis, cholecystitis, pancreatitis, ruptured viscus, UTI, kidney stone, or any other abdominal etiology.  KUB shows no obstruction or stones. Black spots on tongue and dark stool seems consistent with Pepto-Bismol use. Supportive therapy indicated with return if symptoms worsen.  Patient counseled.   Final Clinical Impressions(s) / ED Diagnoses   Final diagnoses:  Nausea vomiting and diarrhea    New Prescriptions Discharge Medication List as of 06/23/2016 12:43 PM    START taking these medications   Details  dicyclomine (BENTYL) 20 MG tablet Take 1 tablet (20 mg total) by mouth 2 (two) times daily., Starting Wed 06/23/2016, Print    potassium chloride SA (K-DUR,KLOR-CON) 20 MEQ tablet Take 1 tablet (20 mEq total) by mouth 2 (two) times daily., Starting Wed 06/23/2016, Print         Rise Mu, PA-C 06/25/16 1610    Benjiman Core, MD 06/25/16 [phone]

## 2017-06-17 ENCOUNTER — Ambulatory Visit: Payer: Medicaid Other

## 2017-09-16 IMAGING — CR DG ABDOMEN 1V
1 series · 1 of 1 positions shown · non-contrast
Comparison: 11/05/2014

CLINICAL DATA: Diarrhea and abdominal pain.

EXAM:
ABDOMEN - 1 VIEW

[abdomen kub]
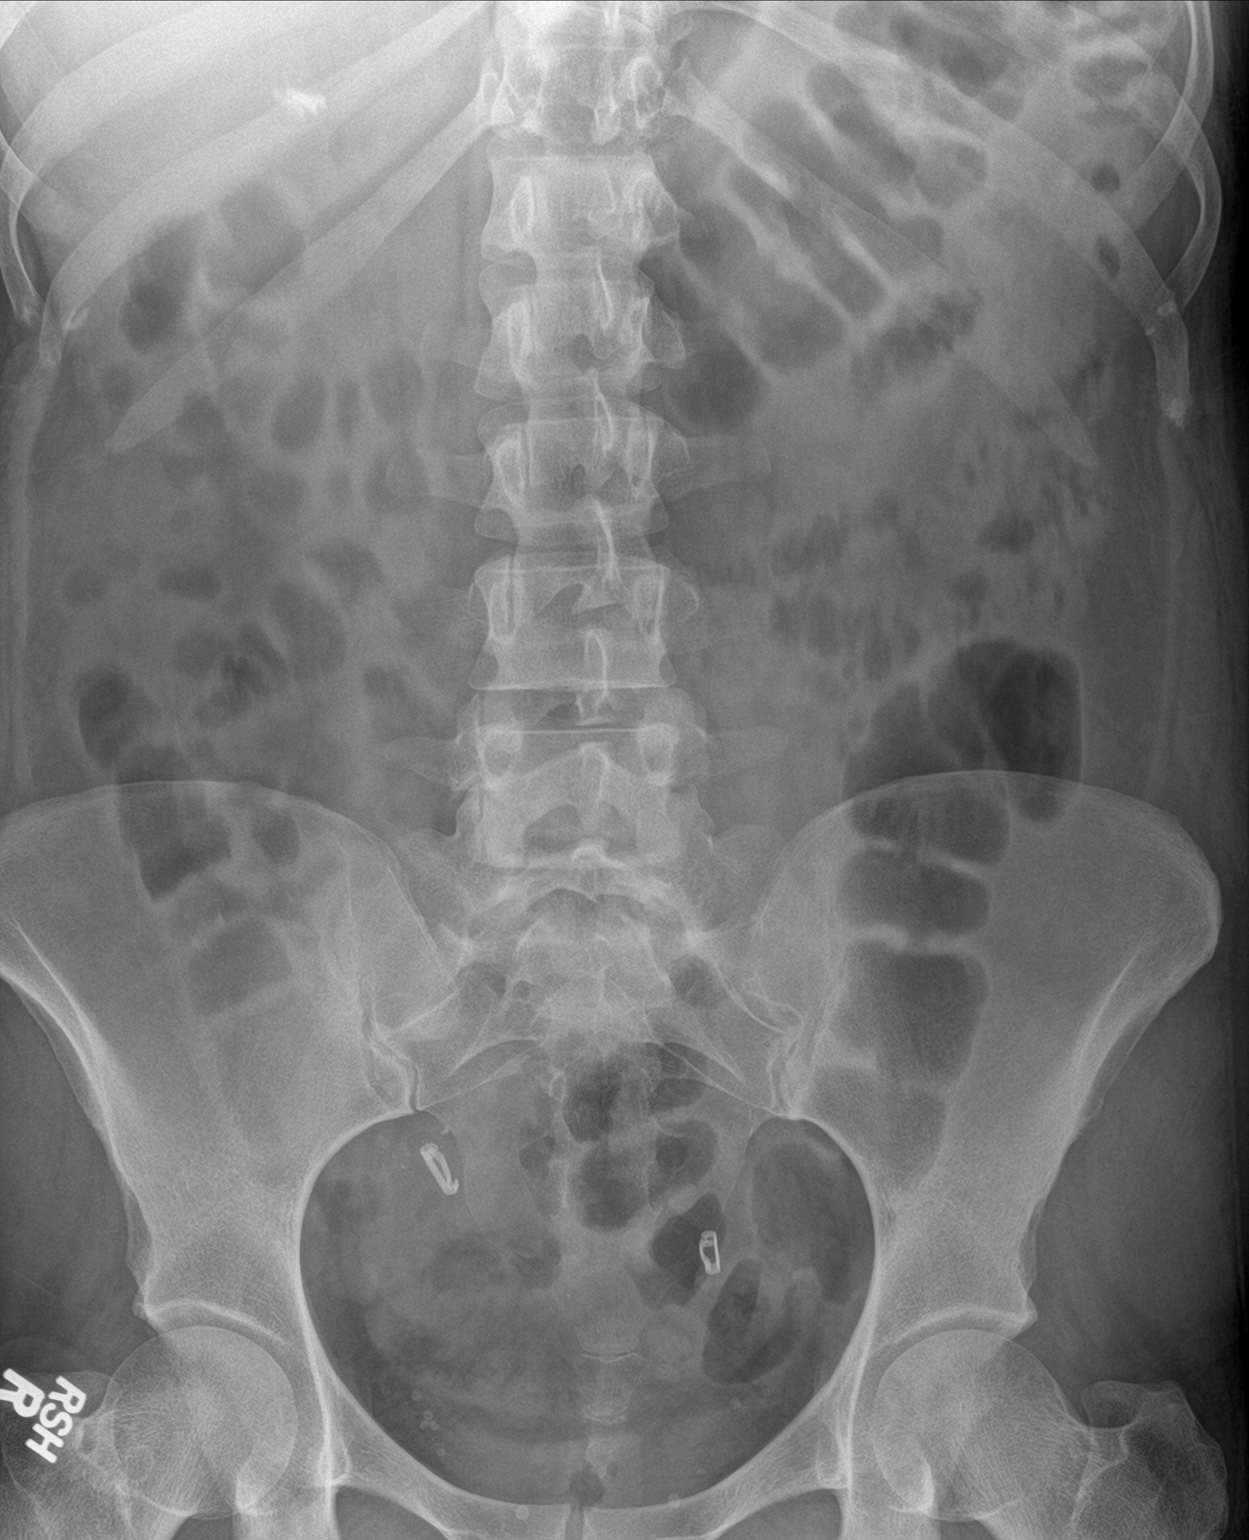

[1 of 1 positions shown; findings below may reference images not displayed]

FINDINGS: The bowel gas pattern is normal. No radio-opaque calculi or other
significant radiographic abnormality are seen. Evidence of previous
cholecystectomy and tubal ligation.
IMPRESSION: Benign-appearing abdomen.

## 2018-10-04 ENCOUNTER — Encounter (HOSPITAL_COMMUNITY): Payer: Self-pay | Admitting: Emergency Medicine

## 2018-10-04 ENCOUNTER — Emergency Department (HOSPITAL_COMMUNITY)
Admission: EM | Admit: 2018-10-04 | Discharge: 2018-10-04 | Disposition: A | Discharge status: Home / Self Care | Payer: Medicaid Other | Attending: Emergency Medicine | Admitting: Emergency Medicine

## 2018-10-04 ENCOUNTER — Other Ambulatory Visit: Payer: Self-pay

## 2018-10-04 DIAGNOSIS — Z87891 Personal history of nicotine dependence: Secondary | ICD-10-CM | POA: Insufficient documentation

## 2018-10-04 DIAGNOSIS — R350 Frequency of micturition: Secondary | ICD-10-CM | POA: Insufficient documentation

## 2018-10-04 DIAGNOSIS — Z79899 Other long term (current) drug therapy: Secondary | ICD-10-CM | POA: Insufficient documentation

## 2018-10-04 LAB — URINALYSIS, ROUTINE W REFLEX MICROSCOPIC
Bilirubin Urine: NEGATIVE
Glucose, UA: NEGATIVE mg/dL
Ketones, ur: NEGATIVE mg/dL
Nitrite: NEGATIVE
Protein, ur: NEGATIVE mg/dL
Specific Gravity, Urine: 1.006 (ref 1.005–1.030)
pH: 6 (ref 5.0–8.0)

## 2018-10-04 LAB — PREGNANCY, URINE: Preg Test, Ur: NEGATIVE

## 2018-10-04 MED ORDER — KETOROLAC TROMETHAMINE 60 MG/2ML IM SOLN
30.0000 mg | Freq: Once | INTRAMUSCULAR | Status: AC
  Administered 2018-10-04: 07:00:00 30 mg via INTRAMUSCULAR
  Filled 2018-10-04: qty 2

## 2018-10-04 NOTE — ED Provider Notes (Signed)
Debra Dominguez  CSN: [id number] Arrival date & time: 10/04/18 9038  Chief Complaint(s) Urinary Frequency  HPI Debra Dominguez is a 38 y.o. female with a history of renal stones presents to the emergency department with 1 day of urinary frequency and urgency.  Patient reports vaginal bleeding reporting that she started her menstrual cycle yesterday.  Endorsing mild pelvic discomfort related to her menstrual cycle.  She denies any dysuria or foul-smelling urine.  No fevers or chills.  Endorsing mild left flank pain that began while waiting in the emergency waiting room.  This is not consistent with her prior renal stone.  The history is provided by the patient.    Past Medical History Past Medical History:  Diagnosis Date  . Abnormal Pap smear of cervix   . Chronic kidney disease    kidney stones  . Gallstones   . No pertinent past medical history    Patient Active Problem List   Diagnosis Date Noted  . Viral URI 02/28/2013  . Tubal ligation evaluation 07/16/2010  . Screening exam for skin cancer 07/16/2010  . Pap smear for cervical cancer screening 07/16/2010  . Overweight(278.02) 10/10/2007  . TOBACCO ABUSE 06/30/2006   Home Medication(s) Prior to Admission medications   Medication Sig Start Date End Date Taking? Authorizing Provider  dicyclomine (BENTYL) 20 MG tablet Take 1 tablet (20 mg total) by mouth 2 (two) times daily. 06/23/16   Doristine Devoid, PA-C  ibuprofen (ADVIL,MOTRIN) 600 MG tablet Take 1 tablet (600 mg total) by mouth every 8 (eight) hours as needed. Patient taking differently: Take 600 mg by mouth every 8 (eight) hours as needed for moderate pain.  09/29/14   Jola Schmidt, MD  ondansetron (ZOFRAN-ODT) 8 MG disintegrating tablet Take 1 tablet (8 mg total) by mouth every 8 (eight) hours as needed for nausea. 06/23/16   Doristine Devoid, PA-C  oxyCODONE-acetaminophen (PERCOCET/ROXICET) 5-325 MG per tablet Take  1 tablet by mouth every 4 (four) hours as needed for severe pain. 09/29/14   Jola Schmidt, MD  potassium chloride SA (K-DUR,KLOR-CON) 20 MEQ tablet Take 1 tablet (20 mEq total) by mouth 2 (two) times daily. 06/23/16   Aaron Edelman                                                                                                                                    Past Surgical History Past Surgical History:  Procedure Laterality Date  . CHOLECYSTECTOMY    . DILATION AND CURETTAGE OF UTERUS    . dilation and curretage    . GALLBLADDER SURGERY    . LAPAROSCOPIC TUBAL LIGATION  10/08/2010   Procedure: LAPAROSCOPIC TUBAL LIGATION;  Surgeon: Juliene Pina C. Hulan Fray, MD;  Location: [facility];  Service: Gynecology;  Laterality: Bilateral;  . LEEP  38 years old   Family History No family history on file.  Social History Social History  Tobacco Use  . Smoking status: Former Smoker    Packs/day: 0.30    Types: Cigarettes    Quit date: 12/02/2011    Years since quitting: 6.8  . Smokeless tobacco: Never Used  Substance Use Topics  . Alcohol use: Yes    Alcohol/week: 0.0 standard drinks    Comment: 1 drink per month  . Drug use: No   Allergies Patient has no known allergies.  Review of Systems Review of Systems All other systems are reviewed and are negative for acute change except as noted in the HPI  Physical Exam Vital Signs  I have reviewed the triage vital signs BP 122/75 (BP Location: Right Arm)   Pulse 90   Temp 98.6 F (37 C) (Oral)   Resp 18   SpO2 99%   Physical Exam Vitals signs reviewed.  Constitutional:      General: She is not in acute distress.    Appearance: She is well-developed. She is not diaphoretic.  HENT:     Head: Normocephalic and atraumatic.     Right Ear: External ear normal.     Left Ear: External ear normal.     Nose: Nose normal.  Eyes:     General: No scleral icterus.    Conjunctiva/sclera: Conjunctivae normal.  Neck:     Musculoskeletal:  Normal range of motion.     Trachea: Phonation normal.  Cardiovascular:     Rate and Rhythm: Normal rate and regular rhythm.  Pulmonary:     Effort: Pulmonary effort is normal. No respiratory distress.     Breath sounds: No stridor.  Abdominal:     General: There is no distension.     Tenderness: There is no abdominal tenderness. There is no right CVA tenderness, left CVA tenderness, guarding or rebound.  Musculoskeletal: Normal range of motion.  Neurological:     Mental Status: She is alert and oriented to person, place, and time.  Psychiatric:        Behavior: Behavior normal.     ED Results and Treatments Labs (all labs ordered are listed, but only abnormal results are displayed) Labs Reviewed  URINALYSIS, ROUTINE W REFLEX MICROSCOPIC - Abnormal; Notable for the following components:      Result Value   Hgb urine dipstick LARGE (*)    Leukocytes,Ua TRACE (*)    Bacteria, UA RARE (*)    All other components within normal limits  PREGNANCY, URINE                                                                                                                         EKG  EKG Interpretation  Date/Time:    Ventricular Rate:    PR Interval:    QRS Duration:   QT Interval:    QTC Calculation:   R Axis:     Text Interpretation:        Radiology No results found.  Pertinent labs & imaging results that were available during my  care of the patient were reviewed by me and considered in my medical decision making (see chart for details).  Medications Ordered in ED Medications  ketorolac (TORADOL) injection 30 mg (30 mg Intramuscular Given 10/04/18 [id number])                                                                                                                                    Procedures Procedures  (including critical care time)  Medical Decision Making / ED Course I have reviewed the nursing notes for this encounter and the patient's prior records (if available  in EHR or on provided paperwork).   Particia Dominguez Auth was evaluated in Emergency Department on 10/04/2018 for the symptoms described in the history of present illness. She was evaluated in the context of the global COVID-19 pandemic, which necessitated consideration that the patient might be at risk for infection with the SARS-CoV-2 virus that causes COVID-19. Institutional protocols and algorithms that pertain to the evaluation of patients at risk for COVID-19 are in a state of rapid change based on information released by regulatory bodies including the CDC and federal and state organizations. These policies and algorithms were followed during the patient's care in the ED.  Patient presents with urinary frequency and abdominal cramping related to her menstrual cycle.  UA with hemoglobin, either from menstrual cycle or hematuria.  Not consistent with a urinary tract infection.  Possible that the patient has small kidney stone.  UPT negative.  The patient appears reasonably screened and/or stabilized for discharge and I doubt any other medical condition or other [facility] requiring further screening, evaluation, or treatment in the ED at this time prior to discharge.       Final Clinical Impression(s) / ED Diagnoses Final diagnoses:  Urinary frequency    The patient appears reasonably screened and/or stabilized for discharge and I doubt any other medical condition or other [facility] requiring further screening, evaluation, or treatment in the ED at this time prior to discharge.  Disposition: Discharge  Condition: Good  I have discussed the results, Dx and Tx plan with the patient who expressed understanding and agree(s) with the plan. Discharge instructions discussed at great length. The patient was given strict return precautions who verbalized understanding of the instructions. No further questions at time of discharge.    ED Discharge Orders    None        Follow Up: Primary care provider   Schedule an appointment as soon as possible for a visit  As needed      This chart was dictated using voice recognition software.  Despite best efforts to proofread,  errors can occur which can change the documentation meaning.   Nira Connardama, Pedro Eduardo, MD 10/05/18 [phone]

## 2018-10-04 NOTE — ED Triage Notes (Signed)
Pt c/o urinary frequency that started this morning. Denies pain with urination or abnormal vaginal discharge.

## 2018-10-04 NOTE — ED Notes (Signed)
Called to have urine preg added onto to urine sent and sent req to lab.

## 2022-09-17 ENCOUNTER — Emergency Department (HOSPITAL_COMMUNITY)
Admission: EM | Admit: 2022-09-17 | Discharge: 2022-09-17 | Disposition: A | Discharge status: Home / Self Care | Payer: Self-pay | Attending: Student | Admitting: Student

## 2022-09-17 ENCOUNTER — Emergency Department (HOSPITAL_COMMUNITY): Payer: Medicaid Other

## 2022-09-17 ENCOUNTER — Other Ambulatory Visit: Payer: Self-pay

## 2022-09-17 DIAGNOSIS — N189 Chronic kidney disease, unspecified: Secondary | ICD-10-CM | POA: Insufficient documentation

## 2022-09-17 DIAGNOSIS — N200 Calculus of kidney: Secondary | ICD-10-CM | POA: Insufficient documentation

## 2022-09-17 DIAGNOSIS — R109 Unspecified abdominal pain: Secondary | ICD-10-CM

## 2022-09-17 DIAGNOSIS — J449 Chronic obstructive pulmonary disease, unspecified: Secondary | ICD-10-CM | POA: Insufficient documentation

## 2022-09-17 LAB — CBC WITH DIFFERENTIAL/PLATELET
Abs Immature Granulocytes: 0.03 10*3/uL (ref 0.00–0.07)
Basophils Absolute: 0.1 10*3/uL (ref 0.0–0.1)
Basophils Relative: 1 %
Eosinophils Absolute: 0.1 10*3/uL (ref 0.0–0.5)
Eosinophils Relative: 1 %
HCT: 41.3 % (ref 36.0–46.0)
Hemoglobin: 13.5 g/dL (ref 12.0–15.0)
Immature Granulocytes: 0 %
Lymphocytes Relative: 23 %
Lymphs Abs: 2.1 10*3/uL (ref 0.7–4.0)
MCH: 27.7 pg (ref 26.0–34.0)
MCHC: 32.7 g/dL (ref 30.0–36.0)
MCV: 84.6 fL (ref 80.0–100.0)
Monocytes Absolute: 0.5 10*3/uL (ref 0.1–1.0)
Monocytes Relative: 5 %
Neutro Abs: 6.4 10*3/uL (ref 1.7–7.7)
Neutrophils Relative %: 70 %
Platelets: 330 10*3/uL (ref 150–400)
RBC: 4.88 MIL/uL (ref 3.87–5.11)
RDW: 12.8 % (ref 11.5–15.5)
WBC: 9.1 10*3/uL (ref 4.0–10.5)
nRBC: 0 % (ref 0.0–0.2)

## 2022-09-17 LAB — URINALYSIS, W/ REFLEX TO CULTURE (INFECTION SUSPECTED)
Bacteria, UA: NONE SEEN
Bilirubin Urine: NEGATIVE
Glucose, UA: NEGATIVE mg/dL
Ketones, ur: NEGATIVE mg/dL
Nitrite: NEGATIVE
Protein, ur: NEGATIVE mg/dL
Specific Gravity, Urine: <1.005 — ABNORMAL LOW (ref 1.005–1.030)
pH: 6 (ref 5.0–8.0)

## 2022-09-17 LAB — BASIC METABOLIC PANEL
Anion gap: 10 (ref 5–15)
BUN: 6 mg/dL (ref 6–20)
CO2: 19 mmol/L — ABNORMAL LOW (ref 22–32)
Calcium: 8.7 mg/dL — ABNORMAL LOW (ref 8.9–10.3)
Chloride: 110 mmol/L (ref 98–111)
Creatinine, Ser: 0.75 mg/dL (ref 0.44–1.00)
GFR, Estimated: >60 mL/min (ref >60)
Glucose, Bld: 94 mg/dL (ref 70–99)
Potassium: 4.7 mmol/L (ref 3.5–5.1)
Sodium: 139 mmol/L (ref 135–145)

## 2022-09-17 NOTE — ED Triage Notes (Signed)
Pt. Stated, I woke this morning had some back pain and was peeing a lot. Ive had kidney stones before but me symptoms have gone and Im not peeing as much.

## 2022-09-17 NOTE — ED Provider Notes (Signed)
[place] EMERGENCY DEPARTMENT AT [facility] Provider Note   CSN: [id number] Arrival date & time: 09/17/22  0920     History  Chief Complaint  Patient presents with   Back Pain   Flank Pain   Polyuria    Debra Dominguez is a 42 y.o. female with past medical history significant for tobacco abuse, CKD, kidney stones presents to the ED complaining of left sided flank/back pain and urinary urgency.  She states her symptoms began this morning.  Denies fever, abdominal pain, hematuria, decreased urine, dysuria, difficulty urinating.  She states her symptoms have improved since this morning and she is no longer having pain.         Home Medications Prior to Admission medications   Medication Sig Start Date End Date Taking? Authorizing Provider  dicyclomine (BENTYL) 20 MG tablet Take 1 tablet (20 mg total) by mouth 2 (two) times daily. 06/23/16   Rise Mu, PA-C  ibuprofen (ADVIL,MOTRIN) 600 MG tablet Take 1 tablet (600 mg total) by mouth every 8 (eight) hours as needed. Patient taking differently: Take 600 mg by mouth every 8 (eight) hours as needed for moderate pain.  09/29/14   Azalia Bilis, MD  ondansetron (ZOFRAN-ODT) 8 MG disintegrating tablet Take 1 tablet (8 mg total) by mouth every 8 (eight) hours as needed for nausea. 06/23/16   Rise Mu, PA-C  oxyCODONE-acetaminophen (PERCOCET/ROXICET) 5-325 MG per tablet Take 1 tablet by mouth every 4 (four) hours as needed for severe pain. 09/29/14   Azalia Bilis, MD  potassium chloride SA (K-DUR,KLOR-CON) 20 MEQ tablet Take 1 tablet (20 mEq total) by mouth 2 (two) times daily. 06/23/16   Rise Mu, PA-C      Allergies    Patient has no known allergies.    Review of Systems   Review of Systems  Constitutional:  Negative for fever.  Gastrointestinal:  Negative for abdominal pain.  Genitourinary:  Positive for flank pain and urgency. Negative for decreased urine volume, difficulty urinating, dysuria  and hematuria.  Musculoskeletal:  Positive for back pain.    Physical Exam Updated Vital Signs BP 138/77 (BP Location: Right Arm)   Pulse 86   Temp 98.4 F (36.9 C) (Oral)   Resp 16   LMP 08/24/2022   SpO2 99%  Physical Exam Vitals and nursing note reviewed.  Constitutional:      General: She is not in acute distress.    Appearance: Normal appearance. She is not ill-appearing or diaphoretic.  Cardiovascular:     Rate and Rhythm: Normal rate and regular rhythm.  Pulmonary:     Effort: Pulmonary effort is normal.  Abdominal:     General: Abdomen is flat.     Palpations: Abdomen is soft.     Tenderness: There is no abdominal tenderness. There is no right CVA tenderness or left CVA tenderness.  Musculoskeletal:     Lumbar back: No tenderness or bony tenderness. Normal range of motion.  Skin:    General: Skin is warm and dry.     Capillary Refill: Capillary refill takes less than 2 seconds.  Neurological:     Mental Status: She is alert. Mental status is at baseline.  Psychiatric:        Mood and Affect: Mood normal.        Behavior: Behavior normal.     ED Results / Procedures / Treatments   Labs (all labs ordered are listed, but only abnormal results are displayed) Labs Reviewed  BASIC METABOLIC PANEL  CBC WITH DIFFERENTIAL/PLATELET  URINALYSIS, W/ REFLEX TO CULTURE (INFECTION SUSPECTED)    EKG None  Radiology No results found.  Procedures Procedures    Medications Ordered in ED Medications - No data to display  ED Course/ Medical Decision Making/ A&P                                 Medical Decision Making Amount and/or Complexity of Data Reviewed Labs: ordered. Radiology: ordered.   This patient presents to the ED with chief complaint(s) of back pain, flank pain, urinary urgency with pertinent past medical history of kidney stones, CKD.  The complaint involves an extensive differential diagnosis and also carries with it a high risk of complications  and morbidity.    The differential diagnosis includes kidney stone, other obstructive uropathy, UTI   The initial plan is to obtain labs, UA, CT renal stone study  Initial Assessment:   Exam significant for overall well appearing patient who is not in acute distress.  Abdomen is soft and non tender to palpation.  No CVA tenderness bilaterally.  No reproducible back pain.  Skin is warm and dry.  Normal ROM of lumbar spine.    Independent ECG/labs interpretation:  The following labs were independently interpreted:  UA without infection, large amount of microscopic Hgb.    Independent visualization and interpretation of imaging: I independently visualized the following imaging with scope of interpretation limited to determining acute life threatening conditions related to emergency care: CT renal, which revealed small stone near the left UVJ within the bladder.   Disposition:   Patient's symptoms consistent with kidney stone that is now within the bladder.  Advised patient to increase fluid consumption to help stone completely pass from the bladder.  She is currently asymptomatic.  Recommended ibuprofen if she experiences discomfort.    The patient has been appropriately medically screened and/or stabilized in the ED. I have low suspicion for any other emergent medical condition which would require further screening, evaluation or treatment in the ED or require inpatient management. At time of discharge the patient is hemodynamically stable and in no acute distress. I have discussed work-up results and diagnosis with patient and answered all questions. Patient is agreeable with discharge plan. We discussed strict return precautions for returning to the emergency department and they verbalized understanding.    Social Determinants of Health:   Patient's  tobacco use   increases the complexity of managing their presentation         Final Clinical Impression(s) / ED Diagnoses Final  diagnoses:  None    Rx / DC Orders ED Discharge Orders     None         Lenard Simmer, PA-C 09/17/22 1538    Glendora Score, MD 09/18/22 1319

## 2022-09-17 NOTE — ED Notes (Signed)
Pt verbalized understanding of discharge instructions. Pt ambulated from ed with a steady gait.

## 2022-09-17 NOTE — Discharge Instructions (Addendum)
Thank you for allowing [place] to be a part of your care today.  You were evaluated in the ED for back/flank pain and urinary urgency.    Your CT scan shows a small kidney stone in your bladder.  You may experience some discomfort when this passes from your bladder during urination.  Increase your water consumption to help flush this out.  I recommend taking 600-800 mg of ibuprofen for pain.   Return to the ED if you develop sudden worsening of your symptoms or if you have any new concerns.

## 2023-01-05 ENCOUNTER — Encounter (HOSPITAL_COMMUNITY): Payer: Self-pay

## 2023-01-05 ENCOUNTER — Ambulatory Visit (HOSPITAL_COMMUNITY)
Admission: EM | Admit: 2023-01-05 | Discharge: 2023-01-05 | Disposition: A | Discharge status: Home / Self Care | Payer: Medicaid Other

## 2023-01-05 DIAGNOSIS — R197 Diarrhea, unspecified: Secondary | ICD-10-CM

## 2023-01-05 DIAGNOSIS — R110 Nausea: Secondary | ICD-10-CM

## 2023-01-05 NOTE — ED Triage Notes (Addendum)
 Pt presents to urgent care today for work note. Pt states she ate [employer] on Sunday with her family, shortly after she began to have vomiting and diarrhea. Pt is able to tolerate fluids and most of my symptoms are resolved now, my stomach still feels bubbly but I feel better. Pt denies taking medications for reported symptoms.

## 2023-01-05 NOTE — ED Notes (Signed)
 Work note provided.

## 2023-01-05 NOTE — Discharge Instructions (Signed)
 Follow a bland diet, avoiding spicy, fried or heavily processed foods. For nausea you can try ginger or peppermint. Symptoms should gradually improve, return to clinic if any changes or no improvement.

## 2023-01-05 NOTE — ED Provider Notes (Signed)
 [facility]    CSN: [id number] Arrival date & time: 01/05/23  9095      History   Chief Complaint Chief Complaint  Patient presents with   Letter for School/Work    HPI Debra Dominguez is a 43 y.o. female.   Patient presents to clinic requesting a return to work note. She was sick for the past few days after eating a chicken sandwich from [facility]. Her son was sick shortly after eating [employer] on Sunday night as well, patient reports she got nauseous and had diarrhea after cleaning up his vomit.   Has had hot and cold chills as well as some congestion. No emesis.   The history is provided by the patient and medical records.    Past Medical History:  Diagnosis Date   Abnormal Pap smear of cervix    Chronic kidney disease    kidney stones   Gallstones    No pertinent past medical history     Patient Active Problem List   Diagnosis Date Noted   Viral URI 02/28/2013   Tubal ligation evaluation 07/16/2010   Screening exam for skin cancer 07/16/2010   Pap smear for cervical cancer screening 07/16/2010   Overweight(278.02) 10/10/2007   TOBACCO ABUSE 06/30/2006    Past Surgical History:  Procedure Laterality Date   CHOLECYSTECTOMY     DILATION AND CURETTAGE OF UTERUS     dilation and curretage     GALLBLADDER SURGERY     LAPAROSCOPIC TUBAL LIGATION  10/08/2010   Procedure: LAPAROSCOPIC TUBAL LIGATION;  Surgeon: Harland C. Starla, MD;  Location: [facility];  Service: Gynecology;  Laterality: Bilateral;   LEEP  43 years old    OB History     Gravida  3   Para  2   Term  2   Preterm      AB  1   Living  2      SAB  1   IAB      Ectopic      Multiple      Live Births               Home Medications    Prior to Admission medications   Medication Sig Start Date End Date Taking? Authorizing Provider  dicyclomine  (BENTYL ) 20 MG tablet Take 1 tablet (20 mg total) by mouth 2 (two) times daily. 06/23/16   Annabell Vinie DASEN, PA-C  ibuprofen   (ADVIL ,MOTRIN ) 600 MG tablet Take 1 tablet (600 mg total) by mouth every 8 (eight) hours as needed. Patient taking differently: Take 600 mg by mouth every 8 (eight) hours as needed for moderate pain.  09/29/14   Baxter Drivers, MD  ondansetron  (ZOFRAN -ODT) 8 MG disintegrating tablet Take 1 tablet (8 mg total) by mouth every 8 (eight) hours as needed for nausea. 06/23/16   Annabell Vinie DASEN, PA-C  oxyCODONE -acetaminophen  (PERCOCET/ROXICET) 5-325 MG per tablet Take 1 tablet by mouth every 4 (four) hours as needed for severe pain. 09/29/14   Baxter Drivers, MD  potassium chloride  SA (K-DUR,KLOR-CON ) 20 MEQ tablet Take 1 tablet (20 mEq total) by mouth 2 (two) times daily. 06/23/16   Annabell Vinie DASEN, PA-C    Family History History reviewed. No pertinent family history.  Social History Social History   Tobacco Use   Smoking status: Every Day    Current packs/day: 0.00    Types: Cigarettes    Last attempt to quit: 12/02/2011    Years since quitting: 11.1   Smokeless tobacco:  Never  Vaping Use   Vaping status: Never Used  Substance Use Topics   Alcohol use: Yes    Alcohol/week: 0.0 standard drinks of alcohol    Comment: 1 drink per month   Drug use: No     Allergies   Patient has no known allergies.   Review of Systems Review of Systems  Per HPI   Physical Exam Triage Vital Signs ED Triage Vitals  Encounter Vitals Group     BP 01/05/23 0926 134/75     Systolic BP Percentile --      Diastolic BP Percentile --      Pulse Rate 01/05/23 0926 99     Resp 01/05/23 0926 18     Temp 01/05/23 0926 98.1 F (36.7 C)     Temp Source 01/05/23 0926 Oral     SpO2 01/05/23 0926 96 %     Weight 01/05/23 0924 200 lb (90.7 kg)     Height 01/05/23 0924 5' 9 (1.753 m)     Head Circumference --      Peak Flow --      Pain Score 01/05/23 0923 0     Pain Loc --      Pain Education --      Exclude from Growth Chart --    No data found.  Updated Vital Signs BP 134/75 (BP Location:  Right Arm)   Pulse 99   Temp 98.1 F (36.7 C) (Oral)   Resp 18   Ht 5' 9 (1.753 m)   Wt 200 lb (90.7 kg)   LMP 12/22/2022 (Exact Date)   SpO2 96%   BMI 29.53 kg/m   Visual Acuity Right Eye Distance:   Left Eye Distance:   Bilateral Distance:    Right Eye Near:   Left Eye Near:    Bilateral Near:     Physical Exam Vitals and nursing note reviewed.  Constitutional:      Appearance: Normal appearance.  HENT:     Head: Normocephalic and atraumatic.     Right Ear: External ear normal.     Left Ear: External ear normal.     Nose: Nose normal.     Mouth/Throat:     Mouth: Mucous membranes are moist.  Eyes:     Conjunctiva/sclera: Conjunctivae normal.  Cardiovascular:     Rate and Rhythm: Normal rate.     Heart sounds: Normal heart sounds. No murmur heard. Pulmonary:     Effort: Pulmonary effort is normal. No respiratory distress.     Breath sounds: Normal breath sounds.  Musculoskeletal:        General: Normal range of motion.  Skin:    General: Skin is warm and dry.  Neurological:     General: No focal deficit present.     Mental Status: She is alert and oriented to person, place, and time.  Psychiatric:        Mood and Affect: Mood normal.        Behavior: Behavior normal.      UC Treatments / Results  Labs (all labs ordered are listed, but only abnormal results are displayed) Labs Reviewed - No data to display  EKG   Radiology No results found.  Procedures Procedures (including critical care time)  Medications Ordered in UC Medications - No data to display  Initial Impression / Assessment and Plan / UC Course  I have reviewed the triage vital signs and the nursing notes.  Pertinent labs & imaging results that  were available during my care of the patient were reviewed by me and considered in my medical decision making (see chart for details).  Vitals and triage reviewed, patient is hemodynamically stable.  Lungs are vesicular, heart with regular  rate and rhythm.  No vomiting, mild nausea remains.  Patient reports overall her symptoms have improved, no abdominal pain.  Suspect either food poisoning or viral gastroenteritis.  Management is the same with bland diet and nausea management.  Work note provided to return on Friday.  Plan of care, follow-up care return precautions given, no questions at this time.     Final Clinical Impressions(s) / UC Diagnoses   Final diagnoses:  Diarrhea, unspecified type  Nausea without vomiting     Discharge Instructions      Follow a bland diet, avoiding spicy, fried or heavily processed foods. For nausea you can try ginger or peppermint. Symptoms should gradually improve, return to clinic if any changes or no improvement.     ED Prescriptions   None    PDMP not reviewed this encounter.   Dreama Randa SAILOR, [place] 01/05/23 [phone]
